# Patient Record
Sex: Male | Born: 1945 | Race: White | Hispanic: No | Marital: Single | Smoking: Never smoker
Health system: Southern US, Community
[De-identification: ages and names within clinical notes are randomized; demographics above are authoritative.]

## PROBLEM LIST (undated history)

## (undated) DIAGNOSIS — R079 Chest pain, unspecified: Secondary | ICD-10-CM

## (undated) DIAGNOSIS — R0602 Shortness of breath: Secondary | ICD-10-CM

## (undated) DIAGNOSIS — I10 Essential (primary) hypertension: Secondary | ICD-10-CM

## (undated) HISTORY — DX: Shortness of breath: R06.02

## (undated) HISTORY — DX: Essential (primary) hypertension: I10

## (undated) HISTORY — PX: CARDIAC CATHETERIZATION: SHX172

## (undated) HISTORY — DX: Chest pain, unspecified: R07.9

---

## 1999-12-05 ENCOUNTER — Encounter: Payer: Self-pay | Admitting: Internal Medicine

## 1999-12-05 ENCOUNTER — Encounter: Admission: RE | Admit: 1999-12-05 | Discharge: 1999-12-05 | Payer: Self-pay | Admitting: Internal Medicine

## 2001-11-27 ENCOUNTER — Encounter: Payer: Self-pay | Admitting: Emergency Medicine

## 2001-11-27 ENCOUNTER — Inpatient Hospital Stay (HOSPITAL_COMMUNITY): Admission: EM | Admit: 2001-11-27 | Discharge: 2001-11-28 | Payer: Self-pay | Admitting: Emergency Medicine

## 2001-11-28 ENCOUNTER — Encounter: Payer: Self-pay | Admitting: Cardiovascular Disease

## 2009-12-14 ENCOUNTER — Encounter: Admission: RE | Admit: 2009-12-14 | Discharge: 2009-12-14 | Payer: Self-pay | Admitting: Family Medicine

## 2011-02-16 NOTE — Cardiovascular Report (Signed)
. [facility]  Patient:    Thomas Bridges, Thomas Bridges Visit Number: [id number] MRN: [id number]          Service Type: MED Location: 2000 2034 01 Attending Physician:  Berry, Jonathan [place] Dictated by:   Delrae Rend, M.D. Proc. Date: 11/28/01 Admit Date:  11/27/2001 Discharge Date: 11/28/2001   CC:         Cardiac Catheterization Laboratory  Pearla Dubonnet, M.D.  [facility]   Cardiac Catheterization  PROCEDURE PERFORMED 1. Left ventriculography. 2. Selective right and left coronary arteriography. 3. Right femoral angiography and closure of the right femoral artery    access sheath with Perclose.  CARDIOLOGIST:  Delrae Rend, M.D.  INDICATIONS:  Mr. Eltringham is a 65 year old gentleman with no significant past medical history.  He was admitted to the hospital with chest pain, suggestive of angina.  He continued to have chest pain in spite of medical therapy, and he had a first set of troponin which was abnormal.  Given his continued chest pain and troponin which was abnormal, he was brought to the cardiac catheterization laboratory to evaluate his coronary anatomy.  HEMODYNAMIC DATA Left ventricular pressure:  133/90. End diastolic pressure:  16 mmHg. Central aortic pressure:  130/69, with a mean of 97 mmHg. There was no significant pressure gradient across the aortic valve.  ANGIOGRAPHIC DATA Left ventriculogram:  The left ventricular systolic function is well preserved in its entirety and is estimated at 60%.  There is no significant mitral regurgitation.  RESULTS 1. Right coronary artery:  The right coronary artery is codominant with    the circumflex coronary artery.  It has a high bifurcation of PDA and    PRV branches.  It is smooth and normal. 2. Left main coronary artery:  The left main coronary artery is a large    caliber vessel.  It is normal. 3. Left anterior descending coronary artery:  The  left anterior descending    coronary artery is a large caliber vessel.  It is smooth in caliber, and    is normal.  It gives origin to a moderate-sized diagonal-1 and    diagonal-2. 4. Circumflex coronary artery:  The circumflex coronary artery is codominant    with the right coronary artery.  It gives origin to a high obtuse    marginal-1and an obtuse marginal-2, and continues as obtuse    marginal-3 which gives origin to several small PDA branches.  The    circumflex coronary artery is normal.  The ascending aortogram revealed    the presence of three aortic valve cusps.  There is no evidence of an    aortic dissection or evidence of aortic regurgitation.  RIGHT FEMORAL ANGIOGRAPHY:  Revealed a good arterial access site.  IMPRESSION 1. Normal left ventricular systolic function. 2. Normal coronary arteries.  RECOMMENDATIONS 1. With his normal coronary arteries, a noncardiac cause of chest pain    evaluation may be indicated. 2. The patient will be discharged home today, and followed up as an    outpatient.  The arterial access sheath has been closed with Perclose.  DESCRIPTION OF PROCEDURE:  Under the usual sterile precautions using a 6-French right femoral arterial access, a 6-French multipurpose V2 catheter was advanced into the ascending aorta over the 0.035 mm J-wire. The catheter was gently advanced into the left ventricle, and the left ventricular pressure monitored.  Hand contrast injection of the left ventricle was performed in the RAO and LAO projections.  The catheter was flushed with saline, pulled back into the ascending aorta, and aortic pressure was monitored.  The right coronary artery was selectively engaged and an arteriography was performed.  This was flushed.  The left main coronary artery was selectively engaged, and an arteriography was performed.  Then the catheter was pulled into the aortic root and the ascending aortogram was performed in the  LAO projection.  Then the catheter was pulled out of the body.  A right femoral arteriography was performed in the RAO projection, and an arterial access sheath was closed with Perclose.  Excellent hemostasis was obtained, and the patient was taken to the recovery room in a stable condition. Dictated by:   Delrae Rend, M.D. Attending Physician:  Berry, Jonathan [place] DD:  11/28/01 TD:  11/30/01 Job: [id number]

## 2011-02-16 NOTE — Discharge Summary (Signed)
. [facility]  Patient:    Thomas Bridges, Thomas Bridges Visit Number: [id number] MRN: [id number]          Service Type: MED Location: 2000 2034 01 Attending Physician:  Berry, Jonathan [place] Dictated by:   Halford Decamp Delanna Ahmadi, R.N., N.P. Admit Date:  11/27/2001 Discharge Date: 11/28/2001   CC:         Madaline Savage, M.D.  Pearla Dubonnet, M.D.   Discharge Summary  HISTORY OF PRESENT ILLNESS:  The patient is a 65 year old, white, married, male patient admitted from the ER secondary to chest pain.  He stated that he had a cath 7 years ago by Dr. Elsie Lincoln and he did not have any stenosis.  He never had office followup after that.  He sees Dr. Kevan Ny approximately every 2 years for a complete physical.  He stated yesterday he was getting limbs off his neighbors yard, he suddenly became short of breath, he had sharp sticking pains, he felt hot, he felt limp, his right arm heavy, he felt mentally out of it, his hands were shaky, he called his wife at work, came home at the end of the day and thought he looked bad so she brought him to emergency room. Episode started about 3:30 p.m. and he came to the ER about 6 p.m.  They started him on IV nitroglycerin and IV heparin.  He was also started on aspirin, Plavix, and Toprol secondary to his complaints about his chest pain. When seen on November 28, 2001, he had intermittent hurting in his mid-chest. He denies any previous hypertension, CVA, DM, no previous CAD, no abdominal aortic aneurysm.  He denies any peptic ulcer disease, no blood clots and no previous hemorrhage.  INITIAL LABORATORIES:  His hemoglobin 13.8, hematocrit was 41.1, wbcs were 6.3, platelets were 257.  His sodium was 139, potassium 3.5, his BUN was 22, his creatinine was 1.0.  His CK-MBs (#1) 226/4.0, troponin 0.02, (#2) was 198/3.2 with a troponin of 0.07.  His EKG showed normal sinus rhythm, he had no ischemic abnormality on it.  HOSPITAL  COURSE:  Because of his symptoms and his elevated troponin, it was decided he should go for a cardiac catheterization; this was performed in the late afternoon on November 28, 2001 by Dr. Delrae Rend.  He had normal coronaries, his EF was 60%, he had no MR, he was perclosed and it was decided he could be discharged home if his groin were stable, he was able to walk in the hallway without any incidents in approximately 3 hours.  Thus, he will be tentatively discharged.  To note, his blood pressure was 197/87 when seen in the ER, his pulse was 105, his respirations were 24, his temperature was 98.5. When he was seen up on the floor, his blood pressure dropped down to 144/76, his pulse was 78, his respirations were 28, his room air O2 saturation was 98%.  We did draw a cholesterol on him and his total cholesterol was 209, his triglycerides were 116, his HDL was 60, and his LDL was 126.  As stated before, he stated he had no prior history of hypertension.  At this time we will not send him home on any hypertensive medications but he may need to be started on this once seen as an outpatient in 1 week.  He will currently be sent home on enteric-coated aspirin 325 mg once per day.  He is to do no strenuous activity, no lifting, no social  activities for 4 days.  He is not to return to work until after he is seen in the office on December 03, 2001.  He cannot sit in a tub of water, hot tub, etc., for 5 days.  If he has any problems with his groin, he will call our office.  DISCHARGE DIAGNOSES: 1. Chest pain somewhat atypical not ischemic related with normal coronaries. 2. Sudden onset of shortness of breath with normal ________ in the hospital. 3. Hypertension when first seen in the hospital in the emergency room. 4. Decreased exercise tolerance over the last 1 year.  PLAN:  He was told to have an office appointment with Dr. Kevan Ny preferably this week.  He was also told that if he has recurrent  symptoms that he is to call someone to drive him to the ER immediately or he is to call 911, he should not try to drive himself. Dictated by:   Halford Decamp Delanna Ahmadi, R.N., N.P. Attending Physician:  Berry, Jonathan [place] DD:  11/28/01 TD:  11/29/01 Job: [id number]

## 2014-03-24 ENCOUNTER — Encounter: Payer: Self-pay | Admitting: Cardiology

## 2014-03-24 ENCOUNTER — Encounter: Payer: Self-pay | Admitting: *Deleted

## 2014-10-05 DIAGNOSIS — C4441 Basal cell carcinoma of skin of scalp and neck: Secondary | ICD-10-CM | POA: Diagnosis not present

## 2014-10-05 DIAGNOSIS — C4442 Squamous cell carcinoma of skin of scalp and neck: Secondary | ICD-10-CM | POA: Diagnosis not present

## 2014-11-25 DIAGNOSIS — L570 Actinic keratosis: Secondary | ICD-10-CM | POA: Diagnosis not present

## 2014-11-25 DIAGNOSIS — C44621 Squamous cell carcinoma of skin of unspecified upper limb, including shoulder: Secondary | ICD-10-CM | POA: Diagnosis not present

## 2014-11-25 DIAGNOSIS — D485 Neoplasm of uncertain behavior of skin: Secondary | ICD-10-CM | POA: Diagnosis not present

## 2014-12-21 DIAGNOSIS — H5203 Hypermetropia, bilateral: Secondary | ICD-10-CM | POA: Diagnosis not present

## 2014-12-21 DIAGNOSIS — H52222 Regular astigmatism, left eye: Secondary | ICD-10-CM | POA: Diagnosis not present

## 2014-12-21 DIAGNOSIS — H5213 Myopia, bilateral: Secondary | ICD-10-CM | POA: Diagnosis not present

## 2014-12-21 DIAGNOSIS — H5210 Myopia, unspecified eye: Secondary | ICD-10-CM | POA: Diagnosis not present

## 2014-12-21 DIAGNOSIS — H2513 Age-related nuclear cataract, bilateral: Secondary | ICD-10-CM | POA: Diagnosis not present

## 2014-12-21 DIAGNOSIS — H524 Presbyopia: Secondary | ICD-10-CM | POA: Diagnosis not present

## 2015-02-24 DIAGNOSIS — H2513 Age-related nuclear cataract, bilateral: Secondary | ICD-10-CM | POA: Diagnosis not present

## 2015-02-24 DIAGNOSIS — H3531 Nonexudative age-related macular degeneration: Secondary | ICD-10-CM | POA: Diagnosis not present

## 2015-04-08 ENCOUNTER — Other Ambulatory Visit: Payer: Self-pay | Admitting: Internal Medicine

## 2015-04-08 DIAGNOSIS — G4710 Hypersomnia, unspecified: Secondary | ICD-10-CM | POA: Diagnosis not present

## 2015-04-08 DIAGNOSIS — D81818 Other biotin-dependent carboxylase deficiency: Secondary | ICD-10-CM | POA: Diagnosis not present

## 2015-04-08 DIAGNOSIS — Z0001 Encounter for general adult medical examination with abnormal findings: Secondary | ICD-10-CM | POA: Diagnosis not present

## 2015-04-08 DIAGNOSIS — R2990 Unspecified symptoms and signs involving the nervous system: Secondary | ICD-10-CM | POA: Diagnosis not present

## 2015-04-08 DIAGNOSIS — G5600 Carpal tunnel syndrome, unspecified upper limb: Secondary | ICD-10-CM | POA: Diagnosis not present

## 2015-04-08 DIAGNOSIS — M6281 Muscle weakness (generalized): Secondary | ICD-10-CM

## 2015-04-08 DIAGNOSIS — R471 Dysarthria and anarthria: Secondary | ICD-10-CM | POA: Diagnosis not present

## 2015-04-08 DIAGNOSIS — R413 Other amnesia: Secondary | ICD-10-CM | POA: Diagnosis not present

## 2015-04-21 ENCOUNTER — Ambulatory Visit
Admission: RE | Admit: 2015-04-21 | Discharge: 2015-04-21 | Disposition: A | Discharge status: Home / Self Care | Payer: Self-pay | Source: Ambulatory Visit | Attending: Internal Medicine | Admitting: Internal Medicine

## 2015-04-21 DIAGNOSIS — M6281 Muscle weakness (generalized): Secondary | ICD-10-CM

## 2015-04-21 DIAGNOSIS — R29810 Facial weakness: Secondary | ICD-10-CM | POA: Diagnosis not present

## 2015-04-21 MED ORDER — GADOBENATE DIMEGLUMINE 529 MG/ML IV SOLN: 14.0000 mL | Freq: Once | INTRAVENOUS | Status: AC | PRN

## 2015-06-21 DIAGNOSIS — D692 Other nonthrombocytopenic purpura: Secondary | ICD-10-CM | POA: Diagnosis not present

## 2015-06-21 DIAGNOSIS — L570 Actinic keratosis: Secondary | ICD-10-CM | POA: Diagnosis not present

## 2015-06-21 DIAGNOSIS — D045 Carcinoma in situ of skin of trunk: Secondary | ICD-10-CM | POA: Diagnosis not present

## 2015-10-13 DIAGNOSIS — M545 Low back pain: Secondary | ICD-10-CM | POA: Diagnosis not present

## 2015-10-13 DIAGNOSIS — R2990 Unspecified symptoms and signs involving the nervous system: Secondary | ICD-10-CM | POA: Diagnosis not present

## 2015-10-13 DIAGNOSIS — D81818 Other biotin-dependent carboxylase deficiency: Secondary | ICD-10-CM | POA: Diagnosis not present

## 2015-10-13 DIAGNOSIS — R471 Dysarthria and anarthria: Secondary | ICD-10-CM | POA: Diagnosis not present

## 2015-10-13 DIAGNOSIS — R269 Unspecified abnormalities of gait and mobility: Secondary | ICD-10-CM | POA: Diagnosis not present

## 2015-11-10 DIAGNOSIS — H43813 Vitreous degeneration, bilateral: Secondary | ICD-10-CM | POA: Diagnosis not present

## 2015-11-10 DIAGNOSIS — H2513 Age-related nuclear cataract, bilateral: Secondary | ICD-10-CM | POA: Diagnosis not present

## 2015-11-10 DIAGNOSIS — H353190 Nonexudative age-related macular degeneration, unspecified eye, stage unspecified: Secondary | ICD-10-CM | POA: Diagnosis not present

## 2016-03-21 DIAGNOSIS — H5213 Myopia, bilateral: Secondary | ICD-10-CM | POA: Diagnosis not present

## 2016-03-21 DIAGNOSIS — H33303 Unspecified retinal break, bilateral: Secondary | ICD-10-CM | POA: Diagnosis not present

## 2016-03-21 DIAGNOSIS — H5210 Myopia, unspecified eye: Secondary | ICD-10-CM | POA: Diagnosis not present

## 2016-04-18 DIAGNOSIS — Z0001 Encounter for general adult medical examination with abnormal findings: Secondary | ICD-10-CM | POA: Diagnosis not present

## 2016-04-18 DIAGNOSIS — R413 Other amnesia: Secondary | ICD-10-CM | POA: Diagnosis not present

## 2016-04-18 DIAGNOSIS — M545 Low back pain: Secondary | ICD-10-CM | POA: Diagnosis not present

## 2016-04-18 DIAGNOSIS — Z1389 Encounter for screening for other disorder: Secondary | ICD-10-CM | POA: Diagnosis not present

## 2016-04-18 DIAGNOSIS — R269 Unspecified abnormalities of gait and mobility: Secondary | ICD-10-CM | POA: Diagnosis not present

## 2016-04-18 DIAGNOSIS — Z79899 Other long term (current) drug therapy: Secondary | ICD-10-CM | POA: Diagnosis not present

## 2016-04-18 DIAGNOSIS — R739 Hyperglycemia, unspecified: Secondary | ICD-10-CM | POA: Diagnosis not present

## 2016-04-18 DIAGNOSIS — D81818 Other biotin-dependent carboxylase deficiency: Secondary | ICD-10-CM | POA: Diagnosis not present

## 2016-04-18 DIAGNOSIS — L570 Actinic keratosis: Secondary | ICD-10-CM | POA: Diagnosis not present

## 2016-04-18 DIAGNOSIS — L219 Seborrheic dermatitis, unspecified: Secondary | ICD-10-CM | POA: Diagnosis not present

## 2016-05-30 DIAGNOSIS — H2513 Age-related nuclear cataract, bilateral: Secondary | ICD-10-CM | POA: Diagnosis not present

## 2016-05-30 DIAGNOSIS — H353190 Nonexudative age-related macular degeneration, unspecified eye, stage unspecified: Secondary | ICD-10-CM | POA: Diagnosis not present

## 2016-07-19 DIAGNOSIS — H2512 Age-related nuclear cataract, left eye: Secondary | ICD-10-CM | POA: Diagnosis not present

## 2016-07-19 DIAGNOSIS — F0390 Unspecified dementia without behavioral disturbance: Secondary | ICD-10-CM | POA: Diagnosis not present

## 2016-07-19 DIAGNOSIS — M1990 Unspecified osteoarthritis, unspecified site: Secondary | ICD-10-CM | POA: Diagnosis not present

## 2016-07-19 DIAGNOSIS — Z720 Tobacco use: Secondary | ICD-10-CM | POA: Diagnosis not present

## 2016-07-19 DIAGNOSIS — Z85820 Personal history of malignant melanoma of skin: Secondary | ICD-10-CM | POA: Diagnosis not present

## 2016-07-19 DIAGNOSIS — Z7982 Long term (current) use of aspirin: Secondary | ICD-10-CM | POA: Diagnosis not present

## 2016-07-19 DIAGNOSIS — Z8673 Personal history of transient ischemic attack (TIA), and cerebral infarction without residual deficits: Secondary | ICD-10-CM | POA: Diagnosis not present

## 2016-07-26 DIAGNOSIS — H25812 Combined forms of age-related cataract, left eye: Secondary | ICD-10-CM | POA: Diagnosis not present

## 2016-07-26 DIAGNOSIS — Z7982 Long term (current) use of aspirin: Secondary | ICD-10-CM | POA: Diagnosis not present

## 2016-07-26 DIAGNOSIS — Z85820 Personal history of malignant melanoma of skin: Secondary | ICD-10-CM | POA: Diagnosis not present

## 2016-07-26 DIAGNOSIS — Z8673 Personal history of transient ischemic attack (TIA), and cerebral infarction without residual deficits: Secondary | ICD-10-CM | POA: Diagnosis not present

## 2016-07-26 DIAGNOSIS — Z87891 Personal history of nicotine dependence: Secondary | ICD-10-CM | POA: Diagnosis not present

## 2016-07-26 DIAGNOSIS — H25813 Combined forms of age-related cataract, bilateral: Secondary | ICD-10-CM | POA: Diagnosis not present

## 2016-08-28 DIAGNOSIS — Z7982 Long term (current) use of aspirin: Secondary | ICD-10-CM | POA: Diagnosis not present

## 2016-08-28 DIAGNOSIS — H25811 Combined forms of age-related cataract, right eye: Secondary | ICD-10-CM | POA: Diagnosis not present

## 2016-08-28 DIAGNOSIS — Z87891 Personal history of nicotine dependence: Secondary | ICD-10-CM | POA: Diagnosis not present

## 2016-08-28 DIAGNOSIS — Z85820 Personal history of malignant melanoma of skin: Secondary | ICD-10-CM | POA: Diagnosis not present

## 2016-11-22 DIAGNOSIS — R739 Hyperglycemia, unspecified: Secondary | ICD-10-CM | POA: Diagnosis not present

## 2016-11-22 DIAGNOSIS — D81818 Other biotin-dependent carboxylase deficiency: Secondary | ICD-10-CM | POA: Diagnosis not present

## 2016-11-22 DIAGNOSIS — R2990 Unspecified symptoms and signs involving the nervous system: Secondary | ICD-10-CM | POA: Diagnosis not present

## 2016-11-22 DIAGNOSIS — R413 Other amnesia: Secondary | ICD-10-CM | POA: Diagnosis not present

## 2016-11-22 DIAGNOSIS — R269 Unspecified abnormalities of gait and mobility: Secondary | ICD-10-CM | POA: Diagnosis not present

## 2016-11-22 DIAGNOSIS — L570 Actinic keratosis: Secondary | ICD-10-CM | POA: Diagnosis not present

## 2017-02-28 DIAGNOSIS — Z961 Presence of intraocular lens: Secondary | ICD-10-CM | POA: Diagnosis not present

## 2017-02-28 DIAGNOSIS — H353132 Nonexudative age-related macular degeneration, bilateral, intermediate dry stage: Secondary | ICD-10-CM | POA: Diagnosis not present

## 2017-02-28 DIAGNOSIS — H43813 Vitreous degeneration, bilateral: Secondary | ICD-10-CM | POA: Diagnosis not present

## 2017-05-08 DIAGNOSIS — H3530 Unspecified macular degeneration: Secondary | ICD-10-CM | POA: Diagnosis not present

## 2017-05-08 DIAGNOSIS — Z961 Presence of intraocular lens: Secondary | ICD-10-CM | POA: Diagnosis not present

## 2017-05-08 DIAGNOSIS — Z9841 Cataract extraction status, right eye: Secondary | ICD-10-CM | POA: Diagnosis not present

## 2017-05-08 DIAGNOSIS — Z9842 Cataract extraction status, left eye: Secondary | ICD-10-CM | POA: Diagnosis not present

## 2017-05-28 DIAGNOSIS — R739 Hyperglycemia, unspecified: Secondary | ICD-10-CM | POA: Diagnosis not present

## 2017-05-28 DIAGNOSIS — G309 Alzheimer's disease, unspecified: Secondary | ICD-10-CM | POA: Diagnosis not present

## 2017-05-28 DIAGNOSIS — Z1211 Encounter for screening for malignant neoplasm of colon: Secondary | ICD-10-CM | POA: Diagnosis not present

## 2017-05-28 DIAGNOSIS — Z0000 Encounter for general adult medical examination without abnormal findings: Secondary | ICD-10-CM | POA: Diagnosis not present

## 2017-05-28 DIAGNOSIS — D81818 Other biotin-dependent carboxylase deficiency: Secondary | ICD-10-CM | POA: Diagnosis not present

## 2017-05-28 DIAGNOSIS — F322 Major depressive disorder, single episode, severe without psychotic features: Secondary | ICD-10-CM | POA: Diagnosis not present

## 2017-05-28 DIAGNOSIS — H6123 Impacted cerumen, bilateral: Secondary | ICD-10-CM | POA: Diagnosis not present

## 2017-05-28 DIAGNOSIS — L570 Actinic keratosis: Secondary | ICD-10-CM | POA: Diagnosis not present

## 2017-05-31 ENCOUNTER — Emergency Department (HOSPITAL_COMMUNITY): Payer: Medicare HMO

## 2017-05-31 ENCOUNTER — Encounter (HOSPITAL_COMMUNITY): Payer: Self-pay

## 2017-05-31 ENCOUNTER — Emergency Department (HOSPITAL_COMMUNITY)
Admission: EM | Admit: 2017-05-31 | Discharge: 2017-05-31 | Disposition: A | Discharge status: Home / Self Care | Payer: Medicare HMO | Attending: Emergency Medicine | Admitting: Emergency Medicine

## 2017-05-31 DIAGNOSIS — T50905A Adverse effect of unspecified drugs, medicaments and biological substances, initial encounter: Secondary | ICD-10-CM

## 2017-05-31 DIAGNOSIS — Y636 Underdosing and nonadministration of necessary drug, medicament or biological substance: Secondary | ICD-10-CM | POA: Diagnosis not present

## 2017-05-31 DIAGNOSIS — T887XXA Unspecified adverse effect of drug or medicament, initial encounter: Secondary | ICD-10-CM | POA: Diagnosis not present

## 2017-05-31 DIAGNOSIS — R2689 Other abnormalities of gait and mobility: Secondary | ICD-10-CM

## 2017-05-31 DIAGNOSIS — Z9181 History of falling: Secondary | ICD-10-CM | POA: Insufficient documentation

## 2017-05-31 DIAGNOSIS — Y829 Unspecified medical devices associated with adverse incidents: Secondary | ICD-10-CM | POA: Insufficient documentation

## 2017-05-31 DIAGNOSIS — I10 Essential (primary) hypertension: Secondary | ICD-10-CM | POA: Insufficient documentation

## 2017-05-31 DIAGNOSIS — R4182 Altered mental status, unspecified: Secondary | ICD-10-CM | POA: Diagnosis not present

## 2017-05-31 DIAGNOSIS — T43295A Adverse effect of other antidepressants, initial encounter: Secondary | ICD-10-CM | POA: Diagnosis not present

## 2017-05-31 LAB — I-STAT TROPONIN, ED: Troponin i, poc: 0.01 ng/mL (ref 0.00–0.08)

## 2017-05-31 LAB — COMPREHENSIVE METABOLIC PANEL
ALT: 13 U/L — AB (ref 17–63)
AST: 10 U/L — AB (ref 15–41)
Albumin: 3.5 g/dL (ref 3.5–5.0)
Alkaline Phosphatase: 102 U/L (ref 38–126)
Anion gap: 8 (ref 5–15)
BUN: 27 mg/dL — AB (ref 6–20)
CHLORIDE: 109 mmol/L (ref 101–111)
CO2: 26 mmol/L (ref 22–32)
CREATININE: 0.86 mg/dL (ref 0.61–1.24)
Calcium: 9 mg/dL (ref 8.9–10.3)
GFR calc non Af Amer: >60 mL/min (ref >60)
Glucose, Bld: 95 mg/dL (ref 65–99)
Potassium: 4 mmol/L (ref 3.5–5.1)
SODIUM: 143 mmol/L (ref 135–145)
Total Bilirubin: 0.5 mg/dL (ref 0.3–1.2)
Total Protein: 5.8 g/dL — ABNORMAL LOW (ref 6.5–8.1)

## 2017-05-31 LAB — CBC WITH DIFFERENTIAL/PLATELET
BASOS ABS: 0 10*3/uL (ref 0.0–0.1)
BASOS PCT: 0 %
EOS ABS: 0.1 10*3/uL (ref 0.0–0.7)
EOS PCT: 2 %
HCT: 36.1 % — ABNORMAL LOW (ref 39.0–52.0)
Hemoglobin: 11.5 g/dL — ABNORMAL LOW (ref 13.0–17.0)
Lymphocytes Relative: 14 %
Lymphs Abs: 0.9 10*3/uL (ref 0.7–4.0)
MCH: 31 pg (ref 26.0–34.0)
MCHC: 31.9 g/dL (ref 30.0–36.0)
MCV: 97.3 fL (ref 78.0–100.0)
Monocytes Absolute: 0.9 10*3/uL (ref 0.1–1.0)
Monocytes Relative: 14 %
Neutro Abs: 4.5 10*3/uL (ref 1.7–7.7)
Neutrophils Relative %: 70 %
PLATELETS: 228 10*3/uL (ref 150–400)
RBC: 3.71 MIL/uL — AB (ref 4.22–5.81)
RDW: 13.2 % (ref 11.5–15.5)
WBC: 6.4 10*3/uL (ref 4.0–10.5)

## 2017-05-31 LAB — URINALYSIS, ROUTINE W REFLEX MICROSCOPIC
BILIRUBIN URINE: NEGATIVE
Glucose, UA: NEGATIVE mg/dL
Hgb urine dipstick: NEGATIVE
Ketones, ur: NEGATIVE mg/dL
Leukocytes, UA: NEGATIVE
NITRITE: NEGATIVE
PH: 6 (ref 5.0–8.0)
Protein, ur: NEGATIVE mg/dL
SPECIFIC GRAVITY, URINE: 1.02 (ref 1.005–1.030)

## 2017-05-31 NOTE — ED Notes (Signed)
Per Wife started Venlafaxine ER 75mg  Tues for depression and suicidical ideation. She states pt has been obtunded, slow to respond to questions, falling frequently, needing more assistance with ADLs, and increased confusion since then. Last night fell hitting his head, neg LOC. Small abrasion noted to forehead. Pt also has abrasion to right lower back from fall this week. Pt awake, alert, following commands at this time. Disoriented to time, situation.

## 2017-05-31 NOTE — ED Notes (Signed)
ED Provider at bedside. 

## 2017-05-31 NOTE — ED Notes (Signed)
Pt transported to MRI 

## 2017-05-31 NOTE — ED Notes (Signed)
Patient transported to CT 

## 2017-05-31 NOTE — ED Triage Notes (Signed)
Pt presents to the er with family for multiple complaints.  The patient has dementia and has been having a lot of depression, he went to see his primary doctor and they prescribed him a medication for it on Tuesday.  Since Wednesday wife said he has not been acting right, states that he acts like he doesn't understand her and has been having a lot of frequent falls. The patient last fell last night and did hit his head, did not take blood thinners.

## 2017-06-01 NOTE — ED Provider Notes (Signed)
[place] [facility] DEPT Provider Note   CSN: [id number] Arrival date & time: 05/31/17  8676     History   Chief Complaint Chief Complaint  Patient presents with  . Medication Reaction  . frequent falls    HPI Thomas Bridges is a 71 y.o. male.  HPI   71 year old male with a history of hypertension and dementia presents with concern for difficulty balancing and frequent falls for the last 4 days.  Family reports that he had some depression, was started on venlafaxine by his primary care physician on Tuesday. Reports after he began taking it, they noted he was having frequent falls, unsteadiness walking worse than usual, and difficulty standing up. Reports that he will stare at times and seemed out of it. They stopped the medication yesterday, but symptoms continued so they came to the emergency department for evaluation. Report otherwise he's had no vomiting, no diarrhea, no fevers, no cough, no noted facial droop or focal weakness. They report he is not a reliable historian, but he is not reported headache, chest pain, shortness of breath, abdominal pain. They report he hit his head and cut his elbow with one of the falls.    Past Medical History:  Diagnosis Date  . Chest pain   . HTN (hypertension)   . SOB (shortness of breath)     There are no active problems to display for this patient.   Past Surgical History:  Procedure Laterality Date  . CARDIAC CATHETERIZATION         Home Medications    Prior to Admission medications   Not on File    Family History No family history on file.  Social History Social History  Substance Use Topics  . Smoking status: Never Smoker  . Smokeless tobacco: Never Used  . Alcohol use No     Allergies   Patient has no known allergies.   Review of Systems Review of Systems  Constitutional: Negative for fever.  HENT: Negative for sore throat.   Eyes: Negative for visual disturbance.  Respiratory: Negative for shortness of  breath.   Cardiovascular: Negative for chest pain.  Gastrointestinal: Negative for abdominal pain, diarrhea, nausea and vomiting.  Genitourinary: Negative for difficulty urinating.  Musculoskeletal: Positive for gait problem. Negative for back pain and neck stiffness.  Skin: Negative for rash.  Neurological: Positive for headaches. Negative for syncope, facial asymmetry, speech difficulty and weakness.     Physical Exam Updated Vital Signs BP (!) 157/72 (BP Location: Right Arm)   Pulse 78   Temp 97.9 F (36.6 C) (Oral)   Resp 18   Wt 67.6 kg (149 lb)   SpO2 96%   Physical Exam  Constitutional: He appears well-developed and well-nourished. No distress.  HENT:  Head: Normocephalic and atraumatic.  Eyes: Conjunctivae and EOM are normal.  Neck: Normal range of motion.  Cardiovascular: Normal rate, regular rhythm, normal heart sounds and intact distal pulses.  Exam reveals no gallop and no friction rub.   No murmur heard. Pulmonary/Chest: Effort normal and breath sounds normal. No respiratory distress. He has no wheezes. He has no rales.  Abdominal: Soft. He exhibits no distension. There is no tenderness. There is no guarding.  Musculoskeletal: He exhibits no edema.  Neurological: He is alert. He has normal strength. No cranial nerve deficit or sensory deficit.  Oriented to self and location not date/year Unable to perform finger to nose, unclear if past-pointing or if having difficulty understanding directions   Skin: Skin is warm  and dry. He is not diaphoretic.  Nursing note and vitals reviewed.    ED Treatments / Results  Labs (all labs ordered are listed, but only abnormal results are displayed) Labs Reviewed  CBC WITH DIFFERENTIAL/PLATELET - Abnormal; Notable for the following:       Result Value   RBC 3.71 (*)    Hemoglobin 11.5 (*)    HCT 36.1 (*)    All other components within normal limits  COMPREHENSIVE METABOLIC PANEL - Abnormal; Notable for the following:     BUN 27 (*)    Total Protein 5.8 (*)    AST 10 (*)    ALT 13 (*)    All other components within normal limits  URINALYSIS, ROUTINE W REFLEX MICROSCOPIC - Abnormal; Notable for the following:    APPearance HAZY (*)    All other components within normal limits  I-STAT TROPONIN, ED    EKG  EKG Interpretation  Date/Time:  Friday May 31 2017 12:44:38 EDT Ventricular Rate:  55 PR Interval:    QRS Duration: 133 QT Interval:  438 QTC Calculation: 419 R Axis:   -50 Text Interpretation:  Sinus rhythm Nonspecific IVCD with LAD Left ventricular hypertrophy Nonspecific conduction delay is new in comparison to prior in 2003 Confirmed by Gareth Morgan [phone]) on 05/31/2017 1:23:15 PM       Radiology Ct Head Wo Contrast  Result Date: 05/31/2017 CLINICAL DATA:  Altered mental status, confusion. EXAM: CT HEAD WITHOUT CONTRAST TECHNIQUE: Contiguous axial images were obtained from the base of the skull through the vertex without intravenous contrast. COMPARISON:  04/21/2015 FINDINGS: Brain: Mild age related volume loss. No acute intracranial abnormality. Specifically, no hemorrhage, hydrocephalus, mass lesion, acute infarction, or significant intracranial injury. Vascular: No hyperdense vessel or unexpected calcification. Skull: No acute calvarial abnormality. Sinuses/Orbits: Visualized paranasal sinuses and mastoids clear. Orbital soft tissues unremarkable. Other: None IMPRESSION: No acute intracranial abnormality. Electronically Signed   By: Rolm Baptise M.D.   On: 05/31/2017 14:34   Mr Brain Wo Contrast  Result Date: 05/31/2017 CLINICAL DATA:  71 year old male with ataxia. Altered mental status and confusion. EXAM: MRI HEAD WITHOUT CONTRAST TECHNIQUE: Multiplanar, multiecho pulse sequences of the brain and surrounding structures were obtained without intravenous contrast. COMPARISON:  Head CT without contrast 1429 hours today. Brain MRI 04/21/2015 FINDINGS: Brain: No restricted diffusion to  suggest acute infarction. No midline shift, mass effect, evidence of mass lesion, ventriculomegaly, extra-axial collection or acute intracranial hemorrhage. Cervicomedullary junction and pituitary are within normal limits. Pearline Cables and white matter signal appears stable since 2016 and normal for age throughout the brain. No cortical encephalomalacia or chronic cerebral blood products. Vascular: Major intracranial vascular flow voids are stable since 2016. Skull and upper cervical spine: Negative. Visualized bone marrow signal is within normal limits. Sinuses/Orbits: Interval postoperative changes to both globes. Otherwise negative orbits soft tissues. Visualized paranasal sinuses and mastoids are stable and well pneumatized. Other: Visible internal auditory structures appear normal. Scalp and face soft tissues appear negative. IMPRESSION: No acute intracranial abnormality. Stable since 2016 and normal for age noncontrast MRI appearance of the brain. Electronically Signed   By: Genevie Ann M.D.   On: 05/31/2017 16:03    Procedures Procedures (including critical care time)  Medications Ordered in ED Medications - No data to display   Initial Impression / Assessment and Plan / ED Course  I have reviewed the triage vital signs and the nursing notes.  Pertinent labs & imaging results that were available during my  care of the patient were reviewed by me and considered in my medical decision making (see chart for details).     71 year old male with a history of hypertension and dementia presents with concern for difficulty balancing and frequent falls for the last 4 days.  Stroke workup including MRI and CT show no acute findings. Patient with good strength in his legs on examination, no neck or back pain, doubt spinal etiology. Workup for generalized weakness completed showing no sign of infection, no sign of arrhythmia or MI. Mild anemia noted however doubt this is contributor. Patient most likely with effects  of medication as etiology for recent gait instability versus worsening dementia.  Recommend continued outpatient follow up.    Final Clinical Impressions(s) / ED Diagnoses   Final diagnoses:  Adverse effect of drug, initial encounter  Imbalance    New Prescriptions There are no discharge medications for this patient.    Gareth Morgan, MD 06/01/17 [phone]

## 2017-06-05 DIAGNOSIS — T887XXD Unspecified adverse effect of drug or medicament, subsequent encounter: Secondary | ICD-10-CM | POA: Diagnosis not present

## 2017-06-05 DIAGNOSIS — S060X9A Concussion with loss of consciousness of unspecified duration, initial encounter: Secondary | ICD-10-CM | POA: Diagnosis not present

## 2017-06-19 DIAGNOSIS — Z23 Encounter for immunization: Secondary | ICD-10-CM | POA: Diagnosis not present

## 2017-06-28 DIAGNOSIS — Z1212 Encounter for screening for malignant neoplasm of rectum: Secondary | ICD-10-CM | POA: Diagnosis not present

## 2017-06-28 DIAGNOSIS — Z1211 Encounter for screening for malignant neoplasm of colon: Secondary | ICD-10-CM | POA: Diagnosis not present

## 2017-07-31 DIAGNOSIS — R269 Unspecified abnormalities of gait and mobility: Secondary | ICD-10-CM | POA: Diagnosis not present

## 2017-07-31 DIAGNOSIS — G309 Alzheimer's disease, unspecified: Secondary | ICD-10-CM | POA: Diagnosis not present

## 2017-07-31 DIAGNOSIS — F322 Major depressive disorder, single episode, severe without psychotic features: Secondary | ICD-10-CM | POA: Diagnosis not present

## 2017-07-31 DIAGNOSIS — S060X9A Concussion with loss of consciousness of unspecified duration, initial encounter: Secondary | ICD-10-CM | POA: Diagnosis not present

## 2017-07-31 DIAGNOSIS — D81818 Other biotin-dependent carboxylase deficiency: Secondary | ICD-10-CM | POA: Diagnosis not present

## 2017-07-31 DIAGNOSIS — T887XXD Unspecified adverse effect of drug or medicament, subsequent encounter: Secondary | ICD-10-CM | POA: Diagnosis not present

## 2017-10-28 DIAGNOSIS — C4442 Squamous cell carcinoma of skin of scalp and neck: Secondary | ICD-10-CM | POA: Diagnosis not present

## 2017-10-28 DIAGNOSIS — L570 Actinic keratosis: Secondary | ICD-10-CM | POA: Diagnosis not present

## 2017-10-28 DIAGNOSIS — D049 Carcinoma in situ of skin, unspecified: Secondary | ICD-10-CM | POA: Diagnosis not present

## 2017-12-05 DIAGNOSIS — C4442 Squamous cell carcinoma of skin of scalp and neck: Secondary | ICD-10-CM | POA: Diagnosis not present

## 2017-12-05 DIAGNOSIS — L988 Other specified disorders of the skin and subcutaneous tissue: Secondary | ICD-10-CM | POA: Diagnosis not present

## 2018-01-21 DIAGNOSIS — R410 Disorientation, unspecified: Secondary | ICD-10-CM | POA: Diagnosis not present

## 2018-01-21 DIAGNOSIS — R269 Unspecified abnormalities of gait and mobility: Secondary | ICD-10-CM | POA: Diagnosis not present

## 2018-01-21 DIAGNOSIS — R609 Edema, unspecified: Secondary | ICD-10-CM | POA: Diagnosis not present

## 2018-01-21 DIAGNOSIS — D81818 Other biotin-dependent carboxylase deficiency: Secondary | ICD-10-CM | POA: Diagnosis not present

## 2018-01-21 DIAGNOSIS — G309 Alzheimer's disease, unspecified: Secondary | ICD-10-CM | POA: Diagnosis not present

## 2018-01-21 DIAGNOSIS — R600 Localized edema: Secondary | ICD-10-CM | POA: Diagnosis not present

## 2018-01-21 DIAGNOSIS — F322 Major depressive disorder, single episode, severe without psychotic features: Secondary | ICD-10-CM | POA: Diagnosis not present

## 2018-01-21 DIAGNOSIS — R350 Frequency of micturition: Secondary | ICD-10-CM | POA: Diagnosis not present

## 2018-01-21 DIAGNOSIS — R296 Repeated falls: Secondary | ICD-10-CM | POA: Diagnosis not present

## 2018-01-31 DIAGNOSIS — M179 Osteoarthritis of knee, unspecified: Secondary | ICD-10-CM | POA: Diagnosis not present

## 2018-03-11 DIAGNOSIS — M179 Osteoarthritis of knee, unspecified: Secondary | ICD-10-CM | POA: Diagnosis not present

## 2018-03-11 DIAGNOSIS — M545 Low back pain: Secondary | ICD-10-CM | POA: Diagnosis not present

## 2018-05-28 DIAGNOSIS — R296 Repeated falls: Secondary | ICD-10-CM | POA: Diagnosis not present

## 2018-05-28 DIAGNOSIS — R269 Unspecified abnormalities of gait and mobility: Secondary | ICD-10-CM | POA: Diagnosis not present

## 2018-05-28 DIAGNOSIS — Z0001 Encounter for general adult medical examination with abnormal findings: Secondary | ICD-10-CM | POA: Diagnosis not present

## 2018-05-28 DIAGNOSIS — G309 Alzheimer's disease, unspecified: Secondary | ICD-10-CM | POA: Diagnosis not present

## 2018-05-28 DIAGNOSIS — L570 Actinic keratosis: Secondary | ICD-10-CM | POA: Diagnosis not present

## 2018-05-28 DIAGNOSIS — R350 Frequency of micturition: Secondary | ICD-10-CM | POA: Diagnosis not present

## 2018-05-28 DIAGNOSIS — R609 Edema, unspecified: Secondary | ICD-10-CM | POA: Diagnosis not present

## 2018-05-28 DIAGNOSIS — D81818 Other biotin-dependent carboxylase deficiency: Secondary | ICD-10-CM | POA: Diagnosis not present

## 2018-05-28 DIAGNOSIS — F322 Major depressive disorder, single episode, severe without psychotic features: Secondary | ICD-10-CM | POA: Diagnosis not present

## 2018-07-28 DIAGNOSIS — R350 Frequency of micturition: Secondary | ICD-10-CM | POA: Diagnosis not present

## 2018-07-29 DIAGNOSIS — R350 Frequency of micturition: Secondary | ICD-10-CM | POA: Diagnosis not present

## 2019-03-12 ENCOUNTER — Emergency Department (HOSPITAL_COMMUNITY): Payer: Medicare HMO

## 2019-03-12 ENCOUNTER — Emergency Department (HOSPITAL_COMMUNITY)
Admission: EM | Admit: 2019-03-12 | Discharge: 2019-03-12 | Disposition: A | Discharge status: Home / Self Care | Payer: Medicare HMO | Attending: Emergency Medicine | Admitting: Emergency Medicine

## 2019-03-12 DIAGNOSIS — S299XXA Unspecified injury of thorax, initial encounter: Secondary | ICD-10-CM | POA: Diagnosis not present

## 2019-03-12 DIAGNOSIS — R531 Weakness: Secondary | ICD-10-CM | POA: Insufficient documentation

## 2019-03-12 DIAGNOSIS — I959 Hypotension, unspecified: Secondary | ICD-10-CM | POA: Diagnosis not present

## 2019-03-12 DIAGNOSIS — F0390 Unspecified dementia without behavioral disturbance: Secondary | ICD-10-CM | POA: Insufficient documentation

## 2019-03-12 DIAGNOSIS — I447 Left bundle-branch block, unspecified: Secondary | ICD-10-CM | POA: Diagnosis not present

## 2019-03-12 DIAGNOSIS — Y9389 Activity, other specified: Secondary | ICD-10-CM | POA: Diagnosis not present

## 2019-03-12 DIAGNOSIS — I10 Essential (primary) hypertension: Secondary | ICD-10-CM | POA: Diagnosis not present

## 2019-03-12 DIAGNOSIS — W1811XA Fall from or off toilet without subsequent striking against object, initial encounter: Secondary | ICD-10-CM | POA: Diagnosis not present

## 2019-03-12 DIAGNOSIS — Y92002 Bathroom of unspecified non-institutional (private) residence single-family (private) house as the place of occurrence of the external cause: Secondary | ICD-10-CM | POA: Diagnosis not present

## 2019-03-12 DIAGNOSIS — M47812 Spondylosis without myelopathy or radiculopathy, cervical region: Secondary | ICD-10-CM | POA: Diagnosis not present

## 2019-03-12 DIAGNOSIS — R296 Repeated falls: Secondary | ICD-10-CM | POA: Diagnosis not present

## 2019-03-12 DIAGNOSIS — Y999 Unspecified external cause status: Secondary | ICD-10-CM | POA: Insufficient documentation

## 2019-03-12 DIAGNOSIS — R404 Transient alteration of awareness: Secondary | ICD-10-CM | POA: Diagnosis not present

## 2019-03-12 DIAGNOSIS — R29810 Facial weakness: Secondary | ICD-10-CM | POA: Diagnosis not present

## 2019-03-12 LAB — CBC WITH DIFFERENTIAL/PLATELET
Abs Immature Granulocytes: 0.03 10*3/uL (ref 0.00–0.07)
Basophils Absolute: 0.1 10*3/uL (ref 0.0–0.1)
Basophils Relative: 1 %
Eosinophils Absolute: 0.3 10*3/uL (ref 0.0–0.5)
Eosinophils Relative: 4 %
HCT: 33 % — ABNORMAL LOW (ref 39.0–52.0)
Hemoglobin: 10.4 g/dL — ABNORMAL LOW (ref 13.0–17.0)
Immature Granulocytes: 0 %
Lymphocytes Relative: 12 %
Lymphs Abs: 0.9 10*3/uL (ref 0.7–4.0)
MCH: 30.1 pg (ref 26.0–34.0)
MCHC: 31.5 g/dL (ref 30.0–36.0)
MCV: 95.7 fL (ref 80.0–100.0)
Monocytes Absolute: 0.6 10*3/uL (ref 0.1–1.0)
Monocytes Relative: 7 %
Neutro Abs: 6 10*3/uL (ref 1.7–7.7)
Neutrophils Relative %: 76 %
Platelets: 268 10*3/uL (ref 150–400)
RBC: 3.45 MIL/uL — ABNORMAL LOW (ref 4.22–5.81)
RDW: 12.6 % (ref 11.5–15.5)
WBC: 7.8 10*3/uL (ref 4.0–10.5)
nRBC: 0 % (ref 0.0–0.2)

## 2019-03-12 LAB — COMPREHENSIVE METABOLIC PANEL
ALT: 11 U/L (ref 0–44)
AST: 11 U/L — ABNORMAL LOW (ref 15–41)
Albumin: 3.3 g/dL — ABNORMAL LOW (ref 3.5–5.0)
Alkaline Phosphatase: 113 U/L (ref 38–126)
Anion gap: 9 (ref 5–15)
BUN: 26 mg/dL — ABNORMAL HIGH (ref 8–23)
CO2: 23 mmol/L (ref 22–32)
Calcium: 8.9 mg/dL (ref 8.9–10.3)
Chloride: 111 mmol/L (ref 98–111)
Creatinine, Ser: 0.97 mg/dL (ref 0.61–1.24)
GFR calc Af Amer: >60 mL/min (ref >60)
GFR calc non Af Amer: >60 mL/min (ref >60)
Glucose, Bld: 125 mg/dL — ABNORMAL HIGH (ref 70–99)
Potassium: 3.8 mmol/L (ref 3.5–5.1)
Sodium: 143 mmol/L (ref 135–145)
Total Bilirubin: 0.3 mg/dL (ref 0.3–1.2)
Total Protein: 6 g/dL — ABNORMAL LOW (ref 6.5–8.1)

## 2019-03-12 LAB — URINALYSIS, ROUTINE W REFLEX MICROSCOPIC
Bilirubin Urine: NEGATIVE
Glucose, UA: NEGATIVE mg/dL
Hgb urine dipstick: NEGATIVE
Ketones, ur: NEGATIVE mg/dL
Leukocytes,Ua: NEGATIVE
Nitrite: NEGATIVE
Protein, ur: NEGATIVE mg/dL
Specific Gravity, Urine: 1.019 (ref 1.005–1.030)
pH: 6 (ref 5.0–8.0)

## 2019-03-12 MED ORDER — SODIUM CHLORIDE 0.9 % IV SOLN
INTRAVENOUS | Status: DC
  Administered 2019-03-12: 08:00:00 via INTRAVENOUS

## 2019-03-12 NOTE — ED Notes (Signed)
Patient verbalizes understanding of discharge instructions. Opportunity for questioning and answers were provided. Armband removed by staff, pt discharged from ED.  

## 2019-03-12 NOTE — ED Triage Notes (Signed)
Pt BIB [facility] d/t fall from bedside commode. Pt found supine. Per wife states weakness BLE. Pt AxOx1; oriented to self  Hx Dementia

## 2019-03-12 NOTE — ED Notes (Signed)
Pt is not steady on feet. When standing up pt pt was shaking a lot so I was unable to walk pt due to not feeling comfortable walking him. With and unsteady balance.

## 2019-03-12 NOTE — ED Provider Notes (Signed)
[facility] EMERGENCY DEPARTMENT Provider Note   CSN: [id number] Arrival date & time:        History   Chief Complaint Chief Complaint  Patient presents with  . Fall  . weakness    HPI Thomas Bridges is a 73 y.o. male.     73 year old male with history of dementia presents after witnessed fall by his wife.  All of the history as per EMS he says the patient is at his baseline according to the wife.  Has had decreased p.o. intake times several weeks.  Was sitting on the commode and fell to the side.  No reported LOC.  No recent medication changes.  No recent fever, cough, congestion.  Wife did notice some questionable facial drooping which is different than his normal baseline from his prior stroke.  EMS was called and CBG was 179.  Patient's vital signs are stable and he was transported here for further management.     Past Medical History:  Diagnosis Date  . Chest pain   . HTN (hypertension)   . SOB (shortness of breath)     There are no active problems to display for this patient.   Past Surgical History:  Procedure Laterality Date  . CARDIAC CATHETERIZATION          Home Medications    Prior to Admission medications   Not on File    Family History No family history on file.  Social History Social History   Tobacco Use  . Smoking status: Never Smoker  . Smokeless tobacco: Never Used  Substance Use Topics  . Alcohol use: No  . Drug use: No     Allergies   Patient has no known allergies.   Review of Systems Review of Systems  Unable to perform ROS: Dementia     Physical Exam Updated Vital Signs SpO2 99%   Physical Exam Vitals signs and nursing note reviewed.  Constitutional:      General: He is not in acute distress.    Appearance: Normal appearance. He is well-developed. He is not toxic-appearing.  HENT:     Head: Normocephalic and atraumatic.  Eyes:     General: Lids are normal.     Conjunctiva/sclera:  Conjunctivae normal.     Pupils: Pupils are equal, round, and reactive to light.  Neck:     Musculoskeletal: Normal range of motion and neck supple.     Thyroid: No thyroid mass.     Trachea: No tracheal deviation.  Cardiovascular:     Rate and Rhythm: Normal rate and regular rhythm.     Heart sounds: Normal heart sounds. No murmur. No gallop.   Pulmonary:     Effort: Pulmonary effort is normal. No respiratory distress.     Breath sounds: Normal breath sounds. No stridor. No decreased breath sounds, wheezing, rhonchi or rales.  Abdominal:     General: Bowel sounds are normal. There is no distension.     Palpations: Abdomen is soft.     Tenderness: There is no abdominal tenderness. There is no rebound.  Musculoskeletal: Normal range of motion.        General: No tenderness.       Legs:  Skin:    General: Skin is warm and dry.     Findings: No abrasion or rash.  Neurological:     Mental Status: He is alert.     GCS: GCS eye subscore is 4. GCS verbal subscore is 4. GCS motor  subscore is 6.     Cranial Nerves: No cranial nerve deficit.     Sensory: No sensory deficit.     Comments: Strength is 5 of 5 in all 4 extremities.  Psychiatric:        Attention and Perception: He is inattentive.      ED Treatments / Results  Labs (all labs ordered are listed, but only abnormal results are displayed) Labs Reviewed  URINE CULTURE  CBC WITH DIFFERENTIAL/PLATELET  COMPREHENSIVE METABOLIC PANEL  URINALYSIS, ROUTINE W REFLEX MICROSCOPIC    EKG    Radiology No results found.  Procedures Procedures (including critical care time)  Medications Ordered in ED Medications  0.9 %  sodium chloride infusion (has no administration in time range)     Initial Impression / Assessment and Plan / ED Course  I have reviewed the triage vital signs and the nursing notes.  Pertinent labs & imaging results that were available during my care of the patient were reviewed by me and considered in  my medical decision making (see chart for details).        Work-up here including urinalysis, head CT and labs without acute findings.  Long discussion with wife and patient has gradually been declining over several weeks.  She is going to arrange home health and patient to be discharged  Final Clinical Impressions(s) / ED Diagnoses   Final diagnoses:  None    ED Discharge Orders    None       Lacretia Leigh, MD 03/12/19 1319

## 2019-03-13 LAB — URINE CULTURE: Culture: NO GROWTH

## 2019-03-27 DIAGNOSIS — Z20828 Contact with and (suspected) exposure to other viral communicable diseases: Secondary | ICD-10-CM | POA: Diagnosis not present

## 2019-04-02 DIAGNOSIS — Z20828 Contact with and (suspected) exposure to other viral communicable diseases: Secondary | ICD-10-CM | POA: Diagnosis not present

## 2019-04-06 DIAGNOSIS — R509 Fever, unspecified: Secondary | ICD-10-CM | POA: Diagnosis not present

## 2019-04-06 DIAGNOSIS — R05 Cough: Secondary | ICD-10-CM | POA: Diagnosis not present

## 2019-04-09 ENCOUNTER — Emergency Department (HOSPITAL_COMMUNITY): Payer: Medicare HMO

## 2019-04-09 ENCOUNTER — Inpatient Hospital Stay (HOSPITAL_COMMUNITY)
Admission: EM | Admit: 2019-04-09 | Discharge: 2019-05-02 | DRG: 871 | Disposition: E | Payer: Medicare HMO | Attending: Family Medicine | Admitting: Family Medicine

## 2019-04-09 ENCOUNTER — Encounter (HOSPITAL_COMMUNITY): Payer: Self-pay

## 2019-04-09 DIAGNOSIS — F0390 Unspecified dementia without behavioral disturbance: Secondary | ICD-10-CM | POA: Diagnosis not present

## 2019-04-09 DIAGNOSIS — E870 Hyperosmolality and hypernatremia: Secondary | ICD-10-CM | POA: Diagnosis present

## 2019-04-09 DIAGNOSIS — R05 Cough: Secondary | ICD-10-CM | POA: Diagnosis not present

## 2019-04-09 DIAGNOSIS — A419 Sepsis, unspecified organism: Secondary | ICD-10-CM

## 2019-04-09 DIAGNOSIS — J189 Pneumonia, unspecified organism: Secondary | ICD-10-CM | POA: Diagnosis not present

## 2019-04-09 DIAGNOSIS — J1289 Other viral pneumonia: Secondary | ICD-10-CM | POA: Diagnosis present

## 2019-04-09 DIAGNOSIS — Z87440 Personal history of urinary (tract) infections: Secondary | ICD-10-CM

## 2019-04-09 DIAGNOSIS — A4189 Other specified sepsis: Principal | ICD-10-CM | POA: Diagnosis present

## 2019-04-09 DIAGNOSIS — Z515 Encounter for palliative care: Secondary | ICD-10-CM | POA: Diagnosis not present

## 2019-04-09 DIAGNOSIS — R569 Unspecified convulsions: Secondary | ICD-10-CM | POA: Diagnosis present

## 2019-04-09 DIAGNOSIS — I10 Essential (primary) hypertension: Secondary | ICD-10-CM | POA: Diagnosis present

## 2019-04-09 DIAGNOSIS — Z66 Do not resuscitate: Secondary | ICD-10-CM | POA: Diagnosis not present

## 2019-04-09 DIAGNOSIS — J181 Lobar pneumonia, unspecified organism: Secondary | ICD-10-CM

## 2019-04-09 DIAGNOSIS — R55 Syncope and collapse: Secondary | ICD-10-CM | POA: Diagnosis not present

## 2019-04-09 DIAGNOSIS — R5381 Other malaise: Secondary | ICD-10-CM | POA: Diagnosis not present

## 2019-04-09 DIAGNOSIS — U071 COVID-19: Secondary | ICD-10-CM | POA: Diagnosis not present

## 2019-04-09 DIAGNOSIS — R4182 Altered mental status, unspecified: Secondary | ICD-10-CM | POA: Diagnosis not present

## 2019-04-09 DIAGNOSIS — G9341 Metabolic encephalopathy: Secondary | ICD-10-CM | POA: Diagnosis not present

## 2019-04-09 DIAGNOSIS — Z7982 Long term (current) use of aspirin: Secondary | ICD-10-CM

## 2019-04-09 DIAGNOSIS — N179 Acute kidney failure, unspecified: Secondary | ICD-10-CM | POA: Diagnosis not present

## 2019-04-09 DIAGNOSIS — D638 Anemia in other chronic diseases classified elsewhere: Secondary | ICD-10-CM | POA: Diagnosis present

## 2019-04-09 DIAGNOSIS — J159 Unspecified bacterial pneumonia: Secondary | ICD-10-CM | POA: Diagnosis present

## 2019-04-09 DIAGNOSIS — R627 Adult failure to thrive: Secondary | ICD-10-CM | POA: Diagnosis present

## 2019-04-09 DIAGNOSIS — Z79899 Other long term (current) drug therapy: Secondary | ICD-10-CM

## 2019-04-09 DIAGNOSIS — R404 Transient alteration of awareness: Secondary | ICD-10-CM | POA: Diagnosis not present

## 2019-04-09 DIAGNOSIS — I959 Hypotension, unspecified: Secondary | ICD-10-CM | POA: Diagnosis not present

## 2019-04-09 LAB — CBC WITH DIFFERENTIAL/PLATELET
Abs Immature Granulocytes: 0 10*3/uL (ref 0.00–0.07)
Basophils Absolute: 0 10*3/uL (ref 0.0–0.1)
Basophils Relative: 0 %
Eosinophils Absolute: 0 10*3/uL (ref 0.0–0.5)
Eosinophils Relative: 0 %
HCT: 33.2 % — ABNORMAL LOW (ref 39.0–52.0)
Hemoglobin: 10.6 g/dL — ABNORMAL LOW (ref 13.0–17.0)
Lymphocytes Relative: 6 %
Lymphs Abs: 0.3 10*3/uL — ABNORMAL LOW (ref 0.7–4.0)
MCH: 30.1 pg (ref 26.0–34.0)
MCHC: 31.9 g/dL (ref 30.0–36.0)
MCV: 94.3 fL (ref 80.0–100.0)
Monocytes Absolute: 0.1 10*3/uL (ref 0.1–1.0)
Monocytes Relative: 3 %
Neutro Abs: 4.5 10*3/uL (ref 1.7–7.7)
Neutrophils Relative %: 91 %
Platelets: 177 10*3/uL (ref 150–400)
RBC: 3.52 MIL/uL — ABNORMAL LOW (ref 4.22–5.81)
RDW: 13.8 % (ref 11.5–15.5)
WBC: 4.9 10*3/uL (ref 4.0–10.5)
nRBC: 0 % (ref 0.0–0.2)
nRBC: 0 /100 WBC

## 2019-04-09 LAB — APTT: aPTT: 48 seconds — ABNORMAL HIGH (ref 24–36)

## 2019-04-09 LAB — URINALYSIS, ROUTINE W REFLEX MICROSCOPIC
Bilirubin Urine: NEGATIVE
Glucose, UA: NEGATIVE mg/dL
Hgb urine dipstick: NEGATIVE
Ketones, ur: NEGATIVE mg/dL
Leukocytes,Ua: NEGATIVE
Nitrite: NEGATIVE
Protein, ur: 100 mg/dL — AB
Specific Gravity, Urine: 1.026 (ref 1.005–1.030)
pH: 5 (ref 5.0–8.0)

## 2019-04-09 LAB — COMPREHENSIVE METABOLIC PANEL
ALT: 35 U/L (ref 0–44)
AST: 37 U/L (ref 15–41)
Albumin: 2.7 g/dL — ABNORMAL LOW (ref 3.5–5.0)
Alkaline Phosphatase: 106 U/L (ref 38–126)
Anion gap: 11 (ref 5–15)
BUN: 25 mg/dL — ABNORMAL HIGH (ref 8–23)
CO2: 21 mmol/L — ABNORMAL LOW (ref 22–32)
Calcium: 8.2 mg/dL — ABNORMAL LOW (ref 8.9–10.3)
Chloride: 109 mmol/L (ref 98–111)
Creatinine, Ser: 1.26 mg/dL — ABNORMAL HIGH (ref 0.61–1.24)
GFR calc Af Amer: >60 mL/min (ref >60)
GFR calc non Af Amer: 56 mL/min — ABNORMAL LOW (ref >60)
Glucose, Bld: 129 mg/dL — ABNORMAL HIGH (ref 70–99)
Potassium: 3.8 mmol/L (ref 3.5–5.1)
Sodium: 141 mmol/L (ref 135–145)
Total Bilirubin: 0.4 mg/dL (ref 0.3–1.2)
Total Protein: 5.9 g/dL — ABNORMAL LOW (ref 6.5–8.1)

## 2019-04-09 LAB — SEDIMENTATION RATE: Sed Rate: 35 mm/hr — ABNORMAL HIGH (ref 0–16)

## 2019-04-09 LAB — C-REACTIVE PROTEIN: CRP: 13.7 mg/dL — ABNORMAL HIGH (ref <1.0)

## 2019-04-09 LAB — LACTIC ACID, PLASMA: Lactic Acid, Venous: 1.7 mmol/L (ref 0.5–1.9)

## 2019-04-09 LAB — SARS CORONAVIRUS 2 BY RT PCR (HOSPITAL ORDER, PERFORMED IN ~~LOC~~ HOSPITAL LAB): SARS Coronavirus 2: POSITIVE — AB

## 2019-04-09 LAB — PROCALCITONIN: Procalcitonin: 1.18 ng/mL

## 2019-04-09 LAB — FIBRINOGEN: Fibrinogen: 557 mg/dL — ABNORMAL HIGH (ref 210–475)

## 2019-04-09 LAB — PROTIME-INR
INR: 1.1 (ref 0.8–1.2)
Prothrombin Time: 14.2 seconds (ref 11.4–15.2)

## 2019-04-09 LAB — D-DIMER, QUANTITATIVE: D-Dimer, Quant: 0.99 ug/mL-FEU — ABNORMAL HIGH (ref 0.00–0.50)

## 2019-04-09 LAB — FERRITIN: Ferritin: 313 ng/mL (ref 24–336)

## 2019-04-09 LAB — CBG MONITORING, ED: Glucose-Capillary: 133 mg/dL — ABNORMAL HIGH (ref 70–99)

## 2019-04-09 MED ORDER — SODIUM CHLORIDE 0.9 % IV SOLN
200.0000 mg | Freq: Once | INTRAVENOUS | Status: AC
  Administered 2019-04-09: 200 mg via INTRAVENOUS
  Filled 2019-04-09: qty 40

## 2019-04-09 MED ORDER — SODIUM CHLORIDE 0.9 % IV SOLN
500.0000 mg | INTRAVENOUS | Status: AC
  Administered 2019-04-09 – 2019-04-13 (×5): 500 mg via INTRAVENOUS
  Filled 2019-04-09 (×5): qty 500

## 2019-04-09 MED ORDER — SODIUM CHLORIDE 0.9 % IV SOLN
100.0000 mg | INTRAVENOUS | Status: AC
  Administered 2019-04-10 – 2019-04-13 (×4): 100 mg via INTRAVENOUS
  Filled 2019-04-09 (×4): qty 20

## 2019-04-09 MED ORDER — METHYLPREDNISOLONE SODIUM SUCC 125 MG IJ SOLR
60.0000 mg | Freq: Four times a day (QID) | INTRAMUSCULAR | Status: DC
  Administered 2019-04-09 – 2019-04-10 (×3): 60 mg via INTRAVENOUS
  Filled 2019-04-09 (×3): qty 2

## 2019-04-09 MED ORDER — DONEPEZIL HCL 10 MG PO TABS
10.0000 mg | ORAL_TABLET | Freq: Every day | ORAL | Status: DC
  Administered 2019-04-09 – 2019-04-10 (×2): 10 mg via ORAL
  Filled 2019-04-09 (×2): qty 1

## 2019-04-09 MED ORDER — SODIUM CHLORIDE 0.9 % IV BOLUS
1000.0000 mL | Freq: Once | INTRAVENOUS | Status: AC
  Administered 2019-04-09: 1000 mL via INTRAVENOUS

## 2019-04-09 MED ORDER — VANCOMYCIN HCL 10 G IV SOLR
1500.0000 mg | Freq: Once | INTRAVENOUS | Status: AC
  Administered 2019-04-09: 1500 mg via INTRAVENOUS
  Filled 2019-04-09: qty 1500

## 2019-04-09 MED ORDER — ENOXAPARIN SODIUM 40 MG/0.4ML ~~LOC~~ SOLN
40.0000 mg | SUBCUTANEOUS | Status: DC
  Administered 2019-04-09 – 2019-04-13 (×5): 40 mg via SUBCUTANEOUS
  Filled 2019-04-09 (×5): qty 0.4

## 2019-04-09 MED ORDER — SODIUM CHLORIDE 0.9 % IV SOLN
1.0000 g | INTRAVENOUS | Status: AC
  Administered 2019-04-09 – 2019-04-13 (×5): 1 g via INTRAVENOUS
  Filled 2019-04-09 (×5): qty 10

## 2019-04-09 MED ORDER — SODIUM CHLORIDE 0.9 % IV SOLN
2.0000 g | Freq: Once | INTRAVENOUS | Status: AC
  Administered 2019-04-09: 2 g via INTRAVENOUS
  Filled 2019-04-09: qty 2

## 2019-04-09 MED ORDER — VANCOMYCIN HCL IN DEXTROSE 1-5 GM/200ML-% IV SOLN: 1000.0000 mg | Freq: Once | INTRAVENOUS | Status: DC

## 2019-04-09 MED ORDER — ZINC SULFATE 220 (50 ZN) MG PO CAPS
220.0000 mg | ORAL_CAPSULE | Freq: Every day | ORAL | Status: DC
  Administered 2019-04-10 – 2019-04-12 (×3): 220 mg via ORAL
  Filled 2019-04-09 (×4): qty 1

## 2019-04-09 MED ORDER — VITAMIN C 500 MG PO TABS
500.0000 mg | ORAL_TABLET | Freq: Every day | ORAL | Status: DC
  Administered 2019-04-10 – 2019-04-12 (×3): 500 mg via ORAL
  Filled 2019-04-09 (×4): qty 1

## 2019-04-09 NOTE — Progress Notes (Addendum)
Pt wife [name] updated on status of pt.  2240  Spoke with wife [name] updated on pt status.

## 2019-04-09 NOTE — ED Notes (Signed)
Doug([facility]) called @ 1714-per Arby Barrette, RN called by Levada Dy

## 2019-04-09 NOTE — H&P (Signed)
History and Physical:    Thomas Bridges [facility]   SLH:[id number] DOB: [birth year] DOA: 04/15/2019  Referring MD/provider: Haynes Bridges PCP: Thomas Huddle, MD   Patient coming from: Home  Chief Complaint:  Fever.  History is entirely per patient's wife as patient is unable to provide a history Patient's wife is Thomas Bridges  [phone] (home)  [phone] (cell)  History of Present Illness:   Thomas Bridges is an 73 y.o. male with past medical history significant for advanced dementia who was noted to be febrile by his wife approximately 4 days ago ago along with a decrease in his usual functionality.  At baseline patient needs assistance walking with a walker.  Uses a bedside commode and needs help feeding himself.  He intermittently recognizes his wife and apparently sometimes calls out for his parents at nighttime.   As patient usually gets UTIs she called her PCP who prescribed Levaquin empirically.  However patient's wife noted that he did not defervesce and fevers continued to 103 to 104.  Yesterday patient's wife noted episodes where patient became stiff and unresponsive when his fevers were very high.  He subsequently relaxed after the fevers had been treated with Tylenol and cool compresses and patient was sent to the ED.  Patient's wife notes that he has not had a good meal in 2 to 3 days.  She has been trying to force him to drink fluids.  She notes that he spent the last 3 days lying in bed without getting up which is entirely unusual for him.  She also notes that at baseline he can answer questions in 1-2 word sentences however has not spoken intelligibly for the past 3 days.  ED Course:  The patient was noted to be febrile to 100.5 and hypotensive.  COVID test came back positive.  X-ray revealed right basilar infiltrate.  Procalcitonin is elevated at 1.18.  Patient was treated with cefepime and vancomycin empirically.  Pretension responded well to IV fluid resuscitation.  ROS:    ROS   Review of Systems: Patient unable to provide accurate review of systems.  Past Medical History:   Past Medical History:  Diagnosis Date  . Chest pain   . HTN (hypertension)   . SOB (shortness of breath)     Past Surgical History:   Past Surgical History:  Procedure Laterality Date  . CARDIAC CATHETERIZATION      Social History:   Social History   Socioeconomic History  . Marital status: Single    Spouse name: Not on file  . Number of children: Not on file  . Years of education: Not on file  . Highest education level: Not on file  Occupational History  . Not on file  Social Needs  . Financial resource strain: Not on file  . Food insecurity    Worry: Not on file    Inability: Not on file  . Transportation needs    Medical: Not on file    Non-medical: Not on file  Tobacco Use  . Smoking status: Never Smoker  . Smokeless tobacco: Never Used  Substance and Sexual Activity  . Alcohol use: No  . Drug use: No  . Sexual activity: Not on file  Lifestyle  . Physical activity    Days per week: Not on file    Minutes per session: Not on file  . Stress: Not on file  Relationships  . Social connections    Talks on phone: Not on file  Gets together: Not on file    Attends religious service: Not on file    Active member of club or organization: Not on file    Attends meetings of clubs or organizations: Not on file    Relationship status: Not on file  . Intimate partner violence    Fear of current or ex partner: Not on file    Emotionally abused: Not on file    Physically abused: Not on file    Forced sexual activity: Not on file  Other Topics Concern  . Not on file  Social History Narrative  . Not on file    Allergies   Patient has no known allergies.  Family history:   History reviewed. No pertinent family history.  Current Medications:   Prior to Admission medications   Medication Sig Start Date End Date Taking? Authorizing Provider   aspirin EC 325 MG tablet Take 325 mg by mouth daily.   Yes [provider]  donepezil (ARICEPT) 10 MG tablet Take 10 mg by mouth at bedtime. 03/02/19  Yes [provider]  furosemide (LASIX) 20 MG tablet Take 20 mg by mouth daily as needed for fluid. 03/28/19  Yes [provider]  levofloxacin (LEVAQUIN) 500 MG tablet Take 500 mg by mouth daily. For 7 days Started on 7.6.20 04/06/19  Yes [provider]  Multiple Vitamins-Minerals (PRESERVISION AREDS 2) CAPS Take 1 tablet by mouth daily.   Yes [provider]    Physical Exam:   Vitals:   04/18/2019 1546 04/12/2019 1552 04/14/2019 1815 04/22/2019 1830  BP:  (!) 145/76 (!) 110/55 (!) 126/59  Pulse: (!) 59 75 (!) 52 (!) 56  Resp: (!) 22 19 (!) 23 18  Temp:      TempSrc:      SpO2: 100% 99% 94% 100%     Physical Exam: Blood pressure (!) 126/59, pulse (!) 56, temperature (!) 100.5 F (38.1 C), temperature source Rectal, resp. rate 18, SpO2 100 %. Gen: Chronically ill-appearing man looking much older than stated age lying flat in bed in no acute respiratory distress. Eyes: Sclerae anicteric.  Chest: Moderately good air entry bilaterally with no adventitious sounds.  CV: Distant, regular, no audible murmurs. Abdomen: NABS, soft, nondistended, nontender. Extremities: No edema.   Data Review:    Labs: Basic Metabolic Panel: Recent Labs  Lab 04/26/2019 1123  NA 141  K 3.8  CL 109  CO2 21*  GLUCOSE 129*  BUN 25*  CREATININE 1.26*  CALCIUM 8.2*   Liver Function Tests: Recent Labs  Lab 04/18/2019 1123  AST 37  ALT 35  ALKPHOS 106  BILITOT 0.4  PROT 5.9*  ALBUMIN 2.7*   No results for input(s): LIPASE, AMYLASE in the last 168 hours. No results for input(s): AMMONIA in the last 168 hours. CBC: Recent Labs  Lab 04/01/2019 1123  WBC 4.9  NEUTROABS 4.5  HGB 10.6*  HCT 33.2*  MCV 94.3  PLT 177   Cardiac Enzymes: No results for input(s): CKTOTAL, CKMB, CKMBINDEX, TROPONINI in the last  168 hours.  BNP (last 3 results) No results for input(s): PROBNP in the last 8760 hours. CBG: Recent Labs  Lab 04/30/2019 1109  GLUCAP 133*    Urinalysis    Component Value Date/Time   COLORURINE YELLOW 04/04/2019 1123   APPEARANCEUR HAZY (A) 04/25/2019 1123   LABSPEC 1.026 04/19/2019 1123   PHURINE 5.0 04/04/2019 1123   GLUCOSEU NEGATIVE 04/16/2019 1123   HGBUR NEGATIVE 04/19/2019 1123   BILIRUBINUR  NEGATIVE 04/17/2019 1123   KETONESUR NEGATIVE 04/19/2019 1123   PROTEINUR 100 (A) 04/22/2019 1123   NITRITE NEGATIVE 04/04/2019 1123   LEUKOCYTESUR NEGATIVE 04/20/2019 1123      Radiographic Studies: Ct Head Wo Contrast  Result Date: 04/26/2019 CLINICAL DATA:  73 year old male with seizure. Altered level of consciousness. EXAM: CT HEAD WITHOUT CONTRAST TECHNIQUE: Contiguous axial images were obtained from the base of the skull through the vertex without intravenous contrast. COMPARISON:  03/12/2019 FINDINGS: Brain: No evidence of acute infarction, hemorrhage, hydrocephalus, extra-axial collection or mass lesion/mass effect. Mild atrophy and mild chronic small-vessel white matter ischemic changes again noted. Vascular: Carotid atherosclerotic calcifications noted Skull: Normal. Negative for fracture or focal lesion. Sinuses/Orbits: Small amount of fluid/mucosal thickening within the LEFT sphenoid sinus noted. Other: None IMPRESSION: 1. No evidence of acute intracranial abnormality. 2. Mild atrophy and chronic small-vessel white matter ischemic changes. 3. Small amount of fluid/mucosal thickening within the LEFT sphenoid sinus which may represent acute sinusitis. Electronically Signed   By: Margarette Canada M.D.   On: 04/16/2019 12:58   Dg Chest Portable 1 View  Result Date: 04/05/2019 CLINICAL DATA:  Cough, fever and altered mental status. EXAM: PORTABLE CHEST 1 VIEW COMPARISON:  03/12/2019 FINDINGS: The cardiac silhouette, mediastinal and hilar contours are within normal limits and stable.  Vague airspace opacity noted in the right lower lobe in the posterior costophrenic sulcus, suspicious for infiltrate. The left lung is clear. The bony thorax is intact. IMPRESSION: Suspect right basilar infiltrate. Electronically Signed   By: Marijo Sanes M.D.   On: 04/06/2019 11:43    EKG: Independently reviewed.  Sinus rhythm at 66.  Nonspecific intraventricular conduction delay.  Axis -60.  No acute ST-T wave changes.   Assessment/Plan:   Active Problems:   Pneumonia due to COVID-19 virus   CAP (community acquired pneumonia)   AKI (acute kidney injury) ([place])   Dementia ([place])  73 year old man with advanced dementia presents with COVID-19 infection as well as an elevated procalcitonin with right basilar infiltrate.  He may also have an acute sinusitis.  COVID 19 Started treatment with Solu-Medrol Remdesivir requested Oxygen as warranted  CAP Ceftriaxone and azithromycin ordered.  HYPOTENSION Has responded well IV fluid resuscitation  AKI Should respond well to treatment of infection and of SARS-CoV-2.  PATIENT IS DNR  Other information:   DVT prophylaxis: Lovenox ordered. Code Status: DNR Family Communication: Long discussion with patient's wife Jurell Basista, phone number.[phone] (home)  Disposition Plan: Home probably Consults called: None Admission status: Inpatient  The medical decision making on this patient was of high complexity and the patient is at high risk for clinical deterioration, therefore this is a level 3 visit.    Dewaine Oats Tublu Chatterjee [facility]  If 7PM-7AM, please contact night-coverage [email or web address] Password [facility] 04/14/2019, 7:38 PM

## 2019-04-09 NOTE — Progress Notes (Signed)
Patient's wife is Damire Remedios  [phone] (home)  [phone] (cell)

## 2019-04-09 NOTE — ED Notes (Signed)
[facility] just left with pt in transport to [facility].

## 2019-04-09 NOTE — Progress Notes (Signed)
Pharmacy Brief Note  O:  ALT: 35 CXR: possible right basilar infiltrate SpO2: as low as 93 % on RA  A/P:  Patient meets criteria for remdesevir therapy. Will initiate remdesivir 200 mg iv once followed by 100 mg iv daily x 4 days. Will f/u ALT.   Napoleon Form, [facility] 04/26/2019 8:35 PM

## 2019-04-09 NOTE — ED Provider Notes (Signed)
[place] EMERGENCY DEPARTMENT Provider Note   CSN: [id number] Arrival date & time: 04/08/2019  1101   History   Chief Complaint Chief Complaint  Patient presents with  . Possible Sepsis    HPI Thomas Bridges is a 73 y.o. male with history of dementia, hypertension resenting via EMS.  History obtained from wife [name] via telephone number [phone]  She reports he has history of dementia normally alert to self and place and can maintain short conversations.  Patient fever beginning last weekend, 04/04/2019, max fever 103F, held telehealth visit with primary care provider diagnosed with bacterial pneumonia and started on levofloxacin has been taking twice daily since 04/06/2019, intermittent Tylenol use.  Fever, lethargy has continued, wife reports that patient has had a total of 3 "febrile seizures" since yesterday she reports that the patient's arms and legs locked up and shake he has a brief period of unresponsiveness, last episode of this was 6 AM this morning.     HPI  Past Medical History:  Diagnosis Date  . Chest pain   . HTN (hypertension)   . SOB (shortness of breath)     There are no active problems to display for this patient.   Past Surgical History:  Procedure Laterality Date  . CARDIAC CATHETERIZATION          Home Medications    Prior to Admission medications   Medication Sig Start Date End Date Taking? Authorizing Provider  aspirin EC 325 MG tablet Take 325 mg by mouth daily.   Yes [provider]  donepezil (ARICEPT) 10 MG tablet Take 10 mg by mouth at bedtime. 03/02/19  Yes [provider]  furosemide (LASIX) 20 MG tablet Take 20 mg by mouth daily as needed for fluid. 03/28/19  Yes [provider]  levofloxacin (LEVAQUIN) 500 MG tablet Take 500 mg by mouth daily. For 7 days Started on 7.6.20 04/06/19  Yes [provider]  Multiple Vitamins-Minerals (PRESERVISION AREDS 2) CAPS Take 1 tablet by mouth  daily.   Yes [provider]    Family History History reviewed. No pertinent family history.  Social History Social History   Tobacco Use  . Smoking status: Never Smoker  . Smokeless tobacco: Never Used  Substance Use Topics  . Alcohol use: No  . Drug use: No     Allergies   Patient has no known allergies.   Review of Systems Review of Systems  Unable to perform ROS: Dementia   Physical Exam Updated Vital Signs BP 126/72   Pulse (!) 57   Temp (!) 100.5 F (38.1 C) (Rectal)   Resp 17   SpO2 99%   Physical Exam Constitutional:      General: He is not in acute distress.    Appearance: Normal appearance. He is well-developed. He is not ill-appearing or diaphoretic.  HENT:     Head: Normocephalic and atraumatic.     Right Ear: External ear normal.     Left Ear: External ear normal.     Nose: Nose normal.  Eyes:     General: Vision grossly intact. Gaze aligned appropriately.     Pupils: Pupils are equal, round, and reactive to light.  Neck:     Musculoskeletal: Normal range of motion.     Trachea: Trachea and phonation normal. No tracheal deviation.  Cardiovascular:     Rate and Rhythm: Normal rate and regular rhythm.     Pulses: Normal pulses.     Heart sounds:  Normal heart sounds.  Pulmonary:     Effort: Pulmonary effort is normal. No accessory muscle usage or respiratory distress.     Breath sounds: Normal breath sounds and air entry.  Chest:     Chest wall: No deformity, tenderness or crepitus.  Abdominal:     General: There is no distension.     Palpations: Abdomen is soft.     Tenderness: There is no abdominal tenderness. There is no guarding or rebound.  Musculoskeletal: Normal range of motion.  Skin:    General: Skin is warm and dry.  Neurological:     Mental Status: He is alert. Mental status is at baseline. He is confused.     GCS: GCS eye subscore is 4. GCS verbal subscore is 4.     Comments: Arousable to voice, moving bilateral  extremities spontaneously  Psychiatric:        Behavior: Behavior normal.     ED Treatments / Results  Labs (all labs ordered are listed, but only abnormal results are displayed) Labs Reviewed  SARS CORONAVIRUS 2 (HOSPITAL ORDER, [place] [facility]) - Abnormal; Notable for the following components:      Result Value   SARS Coronavirus 2 POSITIVE (*)    All other components within normal limits  COMPREHENSIVE METABOLIC PANEL - Abnormal; Notable for the following components:   CO2 21 (*)    Glucose, Bld 129 (*)    BUN 25 (*)    Creatinine, Ser 1.26 (*)    Calcium 8.2 (*)    Total Protein 5.9 (*)    Albumin 2.7 (*)    GFR calc non Af Amer 56 (*)    All other components within normal limits  CBC WITH DIFFERENTIAL/PLATELET - Abnormal; Notable for the following components:   RBC 3.52 (*)    Hemoglobin 10.6 (*)    HCT 33.2 (*)    Lymphs Abs 0.3 (*)    All other components within normal limits  APTT - Abnormal; Notable for the following components:   aPTT 48 (*)    All other components within normal limits  URINALYSIS, ROUTINE W REFLEX MICROSCOPIC - Abnormal; Notable for the following components:   APPearance HAZY (*)    Protein, ur 100 (*)    Bacteria, UA RARE (*)    All other components within normal limits  CBG MONITORING, ED - Abnormal; Notable for the following components:   Glucose-Capillary 133 (*)    All other components within normal limits  CULTURE, BLOOD (ROUTINE X 2)  CULTURE, BLOOD (ROUTINE X 2)  URINE CULTURE  LACTIC ACID, PLASMA  PROTIME-INR  LACTIC ACID, PLASMA  C-REACTIVE PROTEIN  SEDIMENTATION RATE  FERRITIN  PROCALCITONIN  D-DIMER, QUANTITATIVE (NOT AT [facility])  FIBRINOGEN    EKG EKG Interpretation  Date/Time:  Thursday April 09 2019 11:07:32 EDT Ventricular Rate:  66 PR Interval:    QRS Duration: 128 QT Interval:  420 QTC Calculation: 440 R Axis:   -55 Text Interpretation:  Sinus rhythm Nonspecific IVCD with LAD Left  ventricular hypertrophy Confirmed by Lacretia Leigh ([id number]) on 04/21/2019 11:37:54 AM   Radiology Ct Head Wo Contrast  Result Date: 04/18/2019 CLINICAL DATA:  73 year old male with seizure. Altered level of consciousness. EXAM: CT HEAD WITHOUT CONTRAST TECHNIQUE: Contiguous axial images were obtained from the base of the skull through the vertex without intravenous contrast. COMPARISON:  03/12/2019 FINDINGS: Brain: No evidence of acute infarction, hemorrhage, hydrocephalus, extra-axial collection or mass lesion/mass effect. Mild atrophy and mild  chronic small-vessel white matter ischemic changes again noted. Vascular: Carotid atherosclerotic calcifications noted Skull: Normal. Negative for fracture or focal lesion. Sinuses/Orbits: Small amount of fluid/mucosal thickening within the LEFT sphenoid sinus noted. Other: None IMPRESSION: 1. No evidence of acute intracranial abnormality. 2. Mild atrophy and chronic small-vessel white matter ischemic changes. 3. Small amount of fluid/mucosal thickening within the LEFT sphenoid sinus which may represent acute sinusitis. Electronically Signed   By: Margarette Canada M.D.   On: 05/01/2019 12:58   Dg Chest Portable 1 View  Result Date: 05/01/2019 CLINICAL DATA:  Cough, fever and altered mental status. EXAM: PORTABLE CHEST 1 VIEW COMPARISON:  03/12/2019 FINDINGS: The cardiac silhouette, mediastinal and hilar contours are within normal limits and stable. Vague airspace opacity noted in the right lower lobe in the posterior costophrenic sulcus, suspicious for infiltrate. The left lung is clear. The bony thorax is intact. IMPRESSION: Suspect right basilar infiltrate. Electronically Signed   By: Marijo Sanes M.D.   On: 04/03/2019 11:43    Procedures Procedures (including critical care time)  Medications Ordered in ED Medications  ceFEPIme (MAXIPIME) 2 g in sodium chloride 0.9 % 100 mL IVPB (0 g Intravenous Stopped 04/21/2019 1326)  sodium chloride 0.9 % bolus 1,000 mL (0 mLs  Intravenous Stopped 04/30/2019 1326)  vancomycin (VANCOCIN) 1,500 mg in sodium chloride 0.9 % 500 mL IVPB (1,500 mg Intravenous Bolus from Bag 04/28/2019 1152)  sodium chloride 0.9 % bolus 1,000 mL (1,000 mLs Intravenous Bolus from Bag 04/06/2019 1326)     Initial Impression / Assessment and Plan / ED Course  I have reviewed the triage vital signs and the nursing notes.  Pertinent labs & imaging results that were available during my care of the patient were reviewed by me and considered in my medical decision making (see chart for details).  Clinical Course as of Apr 08 1457  Thu Apr 09, 2019  [phone] [name] wife    [BM]  1357 Hospitalist   [BM]    Clinical Course User Index [BM] Deliah [place], [place]   EKG: Sinus rhythm Nonspecific IVCD with LAD Left ventricular hypertrophy Confirmed by Lacretia Leigh ([id number]) on 04/04/2019 11:37:54 AM CBC nonacute Lactic 1.7 PT/INR within normal limits APTT 48 CBG 133 Blood cultures pending CMP nonacute, had mild elevation in creatinine likely secondary to dehydration and infection Urinalysis with rare bacteria, nitrite negative, 0-5 WBCs, does not appear infectious CXR:  IMPRESSION:  Suspect right basilar infiltrate.   Patient's initial hypotension improved well following IV fluids.  Patient febrile and hypotensive on arrival currently being treated for bacterial pneumonia, code sepsis was initiated.  CT head was ordered as patient with new onset seizures.  CT Head: IMPRESSION:  1. No evidence of acute intracranial abnormality.  2. Mild atrophy and chronic small-vessel white matter ischemic  changes.  3. Small amount of fluid/mucosal thickening within the LEFT sphenoid  sinus which may represent acute sinusitis.  - Patient was started on cefepime and vancomycin for sepsis presumed pneumonia.  Consult called to hospitalist service for admission. - COVID-19 positive, patient is without hypoxia on room air  Discussed case with  hospitalist service for admission and they will be seeing patient here in the ED. I called patient's wife [name] back and informed her of patient's COVID-19 status, she was surprised as he has had 2 recent COVID-19 test which were both negative.  I discussed at length the diagnosis with [name] and she stated understanding, I advised that she practice self isolation and  good quarantine practices and call her primary care provider today to inform them of her husband's diagnosis and that she will need follow-up herself she stated understanding, I advised the same for each of the patient's contacts. - Patient has been admitted to hospitalist service for treatment of his COVID-19 infection, pneumonia and sepsis.   Case was discussed with Dr. Zenia Resides during this visit.   Eldridge Marcott [facility] was evaluated in Emergency Department on 04/21/2019 for the symptoms described in the history of present illness. He was evaluated in the context of the global COVID-19 pandemic, which necessitated consideration that the patient might be at risk for infection with the SARS-CoV-2 virus that causes COVID-19. Institutional protocols and algorithms that pertain to the evaluation of patients at risk for COVID-19 are in a state of rapid change based on information released by regulatory bodies including the CDC and federal and state organizations. These policies and algorithms were followed during the patient's care in the ED.  Note: Portions of this report may have been transcribed using voice recognition software. Every effort was made to ensure accuracy; however, inadvertent computerized transcription errors may still be present. Final Clinical Impressions(s) / ED Diagnoses   Final diagnoses:  COVID-19 virus detected  Community acquired pneumonia of right lower lobe of lung ([place])  Sepsis without acute organ dysfunction, due to unspecified organism [facility])    ED Discharge Orders    None       Gari Crown  04/24/2019 1533    Lacretia Leigh, MD 04/10/19 [phone]

## 2019-04-09 NOTE — ED Triage Notes (Signed)
Pt's wife stated to EMS that he was diagnosed with pneumonia last week which he is being treated for, he arrived to ED on RA, no resp distress noted, oral temp reported to be 100.1. Pt has baseline dementia, however, wife reports he has declined in the how much he speaks/walks & interacts lately.

## 2019-04-10 ENCOUNTER — Other Ambulatory Visit: Payer: Self-pay

## 2019-04-10 DIAGNOSIS — A419 Sepsis, unspecified organism: Secondary | ICD-10-CM

## 2019-04-10 LAB — CBC WITH DIFFERENTIAL/PLATELET
Abs Immature Granulocytes: 0.04 10*3/uL (ref 0.00–0.07)
Basophils Absolute: 0 10*3/uL (ref 0.0–0.1)
Basophils Relative: 0 %
Eosinophils Absolute: 0 10*3/uL (ref 0.0–0.5)
Eosinophils Relative: 0 %
HCT: 34.5 % — ABNORMAL LOW (ref 39.0–52.0)
Hemoglobin: 10.6 g/dL — ABNORMAL LOW (ref 13.0–17.0)
Immature Granulocytes: 1 %
Lymphocytes Relative: 6 %
Lymphs Abs: 0.4 10*3/uL — ABNORMAL LOW (ref 0.7–4.0)
MCH: 29.1 pg (ref 26.0–34.0)
MCHC: 30.7 g/dL (ref 30.0–36.0)
MCV: 94.8 fL (ref 80.0–100.0)
Monocytes Absolute: 0.2 10*3/uL (ref 0.1–1.0)
Monocytes Relative: 3 %
Neutro Abs: 5.6 10*3/uL (ref 1.7–7.7)
Neutrophils Relative %: 90 %
Platelets: 159 10*3/uL (ref 150–400)
RBC: 3.64 MIL/uL — ABNORMAL LOW (ref 4.22–5.81)
RDW: 13.9 % (ref 11.5–15.5)
WBC: 6.2 10*3/uL (ref 4.0–10.5)
nRBC: 0 % (ref 0.0–0.2)

## 2019-04-10 LAB — COMPREHENSIVE METABOLIC PANEL
ALT: 33 U/L (ref 0–44)
AST: 29 U/L (ref 15–41)
Albumin: 2.7 g/dL — ABNORMAL LOW (ref 3.5–5.0)
Alkaline Phosphatase: 90 U/L (ref 38–126)
Anion gap: 13 (ref 5–15)
BUN: 24 mg/dL — ABNORMAL HIGH (ref 8–23)
CO2: 19 mmol/L — ABNORMAL LOW (ref 22–32)
Calcium: 7.8 mg/dL — ABNORMAL LOW (ref 8.9–10.3)
Chloride: 109 mmol/L (ref 98–111)
Creatinine, Ser: 0.74 mg/dL (ref 0.61–1.24)
GFR calc Af Amer: >60 mL/min (ref >60)
GFR calc non Af Amer: >60 mL/min (ref >60)
Glucose, Bld: 164 mg/dL — ABNORMAL HIGH (ref 70–99)
Potassium: 4.1 mmol/L (ref 3.5–5.1)
Sodium: 141 mmol/L (ref 135–145)
Total Bilirubin: 0.3 mg/dL (ref 0.3–1.2)
Total Protein: 6 g/dL — ABNORMAL LOW (ref 6.5–8.1)

## 2019-04-10 LAB — URINE CULTURE: Culture: NO GROWTH

## 2019-04-10 LAB — FERRITIN: Ferritin: 340 ng/mL — ABNORMAL HIGH (ref 24–336)

## 2019-04-10 LAB — PHOSPHORUS: Phosphorus: 2.2 mg/dL — ABNORMAL LOW (ref 2.5–4.6)

## 2019-04-10 LAB — PROCALCITONIN: Procalcitonin: 1.01 ng/mL

## 2019-04-10 LAB — MAGNESIUM: Magnesium: 1.8 mg/dL (ref 1.7–2.4)

## 2019-04-10 LAB — C-REACTIVE PROTEIN: CRP: 19.6 mg/dL — ABNORMAL HIGH (ref <1.0)

## 2019-04-10 LAB — D-DIMER, QUANTITATIVE: D-Dimer, Quant: 1.03 ug/mL-FEU — ABNORMAL HIGH (ref 0.00–0.50)

## 2019-04-10 LAB — ABO/RH: ABO/RH(D): O POS

## 2019-04-10 MED ORDER — METHYLPREDNISOLONE SODIUM SUCC 40 MG IJ SOLR
30.0000 mg | Freq: Two times a day (BID) | INTRAMUSCULAR | Status: DC
  Administered 2019-04-10 – 2019-04-17 (×14): 30 mg via INTRAVENOUS
  Filled 2019-04-10 (×14): qty 1

## 2019-04-10 MED ORDER — ENSURE ENLIVE PO LIQD
237.0000 mL | Freq: Three times a day (TID) | ORAL | Status: DC
  Administered 2019-04-10 – 2019-04-11 (×4): 237 mL via ORAL

## 2019-04-10 NOTE — Plan of Care (Signed)
  Problem: Respiratory: Goal: Will maintain a patent airway Outcome: Progressing   Problem: Clinical Measurements: Goal: Ability to maintain clinical measurements within normal limits will improve Outcome: Progressing   Problem: Clinical Measurements: Goal: Diagnostic test results will improve Outcome: Progressing   Problem: Nutrition: Goal: Adequate nutrition will be maintained Outcome: Progressing   Problem: Education: Goal: Knowledge of risk factors and measures for prevention of condition will improve Outcome: Not Progressing

## 2019-04-10 NOTE — Progress Notes (Signed)
Patient pulled condom cath off. Peri care performed and new condom cath applied with barrier also prior. Safety mittens placed to protect IV and catheter.

## 2019-04-10 NOTE — Progress Notes (Signed)
PROGRESS NOTE  Donyale Berthold [facility] UDJ:[id number] DOB: 1946-02-29 DOA: 04/11/2019 PCP: Josetta Huddle, MD   LOS: 1 day   Brief Narrative / Interim history: 73 year old male with history of advanced dementia who came into the hospital and was admitted on 04/01/2019 after about 4 days of decreasing his usual functionality as well as apparent worsening confusion.  He also has been having poor p.o. intake and weakness, at baseline he can answer 1-2 word sentences however has not spoken for the past 3 days.  He was diagnosed with COVID-19, chest x-ray on admission was concerning for pneumonia and was admitted to the hospital.  Subjective: Patient wakes up when called, looks at me, but does not talk.  Does not follow commands.  Assessment & Plan: Active Problems:   Pneumonia due to COVID-19 virus   CAP (community acquired pneumonia)   AKI (acute kidney injury) ([place])   Dementia ([place])   Principal Problem Acute Hypoxic Respiratory Failure due to Covid-19 Viral Illness /concern for CAP/lobar pneumonia -Patient's procalcitonin was slightly elevated and he was started on ceftriaxone and azithromycin, will continue that for 5 days -He was also started on steroids, continue -Inflammatory markers increasing today however he appears comfortable and remains on room air   COVID-19 Labs  Recent Labs    04/24/2019 1418 04/28/2019 1429 04/10/19 0500 04/10/19 0510  DDIMER  --  0.99* 1.03*  --   FERRITIN 313  --   --  340*  CRP 13.7*  --   --  19.6*    Lab Results  Component Value Date   SARSCOV2NAA POSITIVE (A) 04/24/2019    Fever:  Low-grade temp 99.5 Oxygen requirements:  None, on room air Antibiotics:  Ceftriaxone/azithromycin 7/9, day 2 Remdesivir:  7/9, day 2 Steroids:  7/9, day 2 Diuretics:  None Actemra:  No Convalescent Plasma:  No Vitamin C and Zinc:  Yes  Active Problems Hypertension -Received fluids, improved.  Hold further IV fluids and maintain euvolemia/equal balance  Acute  kidney injury -Creatinine has normalized after fluids.  Advanced dementia -Continue home Aricept  Scheduled Meds: . donepezil  10 mg Oral QHS  . enoxaparin (LOVENOX) injection  40 mg Subcutaneous Q24H  . methylPREDNISolone (SOLU-MEDROL) injection  30 mg Intravenous Q12H  . vitamin C  500 mg Oral Daily  . zinc sulfate  220 mg Oral Daily   Continuous Infusions: . azithromycin Stopped (04/12/2019 2202)  . cefTRIAXone (ROCEPHIN)  IV Stopped (04/10/2019 2100)  . remdesivir 100 mg in NS 250 mL     PRN Meds:.  DVT prophylaxis: Lovenox Code Status: DNR Family Communication: Attempted to call wife Tito Ausmus on her cell, no answer Disposition Plan: TBD  Consultants:   None   Procedures:   None     Objective: Vitals:   04/10/19 0224 04/10/19 0400 04/10/19 0448 04/10/19 0800  BP: (!) 124/58 (!) 139/53    Pulse: (!) 56 (!) 57    Resp: (!) 23 20    Temp: 99.1 F (37.3 C)  99.5 F (37.5 C) 99.1 F (37.3 C)  TempSrc: Oral  Oral Axillary  SpO2: 97% 99%    Weight:      Height:        Intake/Output Summary (Last 24 hours) at 04/10/2019 1036 Last data filed at 04/10/2019 0800 Gross per 24 hour  Intake 2100 ml  Output 625 ml  Net 1475 ml   Filed Weights   05/01/2019 2030 04/04/2019 2033  Weight: 64.4 kg 64.4 kg    Examination:  Constitutional: NAD Eyes: PERRL, lids and conjunctivae normal, no scleral icterus ENMT: Mucous membranes are dry Neck: normal, supple Respiratory: Shallow breathing, diminished breath sounds at the bases, no wheezing, no crackles Cardiovascular: Regular rate and rhythm, no murmurs / rubs / gallops. No LE edema.  Abdomen: no tenderness. Bowel sounds positive.  Musculoskeletal: no clubbing / cyanosis. Skin: no rashes Neurologic: Moves all 4 independently but does not follow commands   Data Reviewed: I have independently reviewed following labs and imaging studies  CBC: Recent Labs  Lab 04/19/2019 1123 04/10/19 0500  WBC 4.9 6.2  NEUTROABS  4.5 5.6  HGB 10.6* 10.6*  HCT 33.2* 34.5*  MCV 94.3 94.8  PLT 177 379   Basic Metabolic Panel: Recent Labs  Lab 04/17/2019 1123 04/10/19 0500  NA 141 141  K 3.8 4.1  CL 109 109  CO2 21* 19*  GLUCOSE 129* 164*  BUN 25* 24*  CREATININE 1.26* 0.74  CALCIUM 8.2* 7.8*  MG  --  1.8  PHOS  --  2.2*   GFR: Estimated Creatinine Clearance: 73.8 mL/min (by C-G formula based on SCr of 0.74 mg/dL). Liver Function Tests: Recent Labs  Lab 04/22/2019 1123 04/10/19 0500  AST 37 29  ALT 35 33  ALKPHOS 106 90  BILITOT 0.4 0.3  PROT 5.9* 6.0*  ALBUMIN 2.7* 2.7*   No results for input(s): LIPASE, AMYLASE in the last 168 hours. No results for input(s): AMMONIA in the last 168 hours. Coagulation Profile: Recent Labs  Lab 04/02/2019 1123  INR 1.1   Cardiac Enzymes: No results for input(s): CKTOTAL, CKMB, CKMBINDEX, TROPONINI in the last 168 hours. BNP (last 3 results) No results for input(s): PROBNP in the last 8760 hours. HbA1C: No results for input(s): HGBA1C in the last 72 hours. CBG: Recent Labs  Lab 04/04/2019 1109  GLUCAP 133*   Lipid Profile: No results for input(s): CHOL, HDL, LDLCALC, TRIG, CHOLHDL, LDLDIRECT in the last 72 hours. Thyroid Function Tests: No results for input(s): TSH, T4TOTAL, FREET4, T3FREE, THYROIDAB in the last 72 hours. Anemia Panel: Recent Labs    04/19/2019 1418 04/10/19 0510  FERRITIN 313 340*   Urine analysis:    Component Value Date/Time   COLORURINE YELLOW 04/29/2019 1123   APPEARANCEUR HAZY (A) 04/08/2019 1123   LABSPEC 1.026 04/13/2019 1123   PHURINE 5.0 04/21/2019 1123   GLUCOSEU NEGATIVE 04/28/2019 1123   HGBUR NEGATIVE 04/26/2019 1123   BILIRUBINUR NEGATIVE 04/14/2019 1123   KETONESUR NEGATIVE 04/26/2019 1123   PROTEINUR 100 (A) 04/03/2019 1123   NITRITE NEGATIVE 04/14/2019 1123   LEUKOCYTESUR NEGATIVE 04/12/2019 1123   Sepsis Labs: Invalid input(s): PROCALCITONIN, LACTICIDVEN  Recent Results (from the past 240 hour(s))  SARS  Coronavirus 2 (CEPHEID - Performed in [place] hospital lab), Hosp Order     Status: Abnormal   Collection Time: 04/26/2019 12:01 PM   Specimen: Nasopharyngeal Swab  Result Value Ref Range Status   SARS Coronavirus 2 POSITIVE (A) NEGATIVE Final    Comment: RESULT CALLED TO, READ BACK BY AND VERIFIED WITH: Claretta Fraise RN 13:45 04/12/2019 ([email or web address]) (NOTE) If result is NEGATIVE SARS-CoV-2 target nucleic acids are NOT DETECTED. The SARS-CoV-2 RNA is generally detectable in upper and lower  respiratory specimens during the acute phase of infection. The lowest  concentration of SARS-CoV-2 viral copies this assay can detect is 250  copies / mL. A negative result does not preclude SARS-CoV-2 infection  and should not be used as the sole basis for treatment or other  patient  management decisions.  A negative result may occur with  improper specimen collection / handling, submission of specimen other  than nasopharyngeal swab, presence of viral mutation(s) within the  areas targeted by this assay, and inadequate number of viral copies  (<250 copies / mL). A negative result must be combined with clinical  observations, patient history, and epidemiological information. If result is POSITIVE SARS-CoV-2 target nucleic acids are DETECTED.  The SARS-CoV-2 RNA is generally detectable in upper and lower  respiratory specimens during the acute phase of infection.  Positive  results are indicative of active infection with SARS-CoV-2.  Clinical  correlation with patient history and other diagnostic information is  necessary to determine patient infection status.  Positive results do  not rule out bacterial infection or co-infection with other viruses. If result is PRESUMPTIVE POSTIVE SARS-CoV-2 nucleic acids MAY BE PRESENT.   A presumptive positive result was obtained on the submitted specimen  and confirmed on repeat testing.  While 2019 novel coronavirus  (SARS-CoV-2) nucleic acids may be present in the  submitted sample  additional confirmatory testing may be necessary for epidemiological  and / or clinical management purposes  to differentiate between  SARS-CoV-2 and other Sarbecovirus currently known to infect humans.  If clinically indicated additional testing with an alternate test  methodology [phone]) i s advised. The SARS-CoV-2 RNA is generally  detectable in upper and lower respiratory specimens during the acute  phase of infection. The expected result is Negative. Fact Sheet for Patients:  StrictlyIdeas.no Fact Sheet for Healthcare Providers: BankingDealers.co.za This test is not yet approved or cleared by the [place] FDA and has been authorized for detection and/or diagnosis of SARS-CoV-2 by FDA under an Emergency Use Authorization (EUA).  This EUA will remain in effect (meaning this test can be used) for the duration of the COVID-19 declaration under Section 564(b)(1) of the Act, 21 U.S.C. section 360bbb-3(b)(1), unless the authorization is terminated or revoked sooner. Performed at [facility], [place] [address] [place], [place] [address]       Radiology Studies: Ct Head Wo Contrast  Result Date: 04/19/2019 CLINICAL DATA:  73 year old male with seizure. Altered level of consciousness. EXAM: CT HEAD WITHOUT CONTRAST TECHNIQUE: Contiguous axial images were obtained from the base of the skull through the vertex without intravenous contrast. COMPARISON:  03/12/2019 FINDINGS: Brain: No evidence of acute infarction, hemorrhage, hydrocephalus, extra-axial collection or mass lesion/mass effect. Mild atrophy and mild chronic small-vessel white matter ischemic changes again noted. Vascular: Carotid atherosclerotic calcifications noted Skull: Normal. Negative for fracture or focal lesion. Sinuses/Orbits: Small amount of fluid/mucosal thickening within the LEFT sphenoid sinus noted. Other: None IMPRESSION: 1. No evidence of acute  intracranial abnormality. 2. Mild atrophy and chronic small-vessel white matter ischemic changes. 3. Small amount of fluid/mucosal thickening within the LEFT sphenoid sinus which may represent acute sinusitis. Electronically Signed   By: Margarette Canada M.D.   On: 04/17/2019 12:58   Dg Chest Portable 1 View  Result Date: 04/25/2019 CLINICAL DATA:  Cough, fever and altered mental status. EXAM: PORTABLE CHEST 1 VIEW COMPARISON:  03/12/2019 FINDINGS: The cardiac silhouette, mediastinal and hilar contours are within normal limits and stable. Vague airspace opacity noted in the right lower lobe in the posterior costophrenic sulcus, suspicious for infiltrate. The left lung is clear. The bony thorax is intact. IMPRESSION: Suspect right basilar infiltrate. Electronically Signed   By: Marijo Sanes M.D.   On: 04/20/2019 11:43    Marzetta Board, MD, PhD [facility]  Contact via  [email or web address]  [place] P: [phone] F: [phone]

## 2019-04-10 NOTE — Plan of Care (Signed)
Care plan initated

## 2019-04-10 NOTE — Progress Notes (Signed)
 Initial Nutrition Assessment   RD working remotely.   DOCUMENTATION CODES:   Not applicable  INTERVENTION:   Downgrade diet to Dysphagia III (EASY to CHEW)  Feeding assistance at all meals  Ensure Enlive po TID, each supplement provides 350 kcal and 20 grams of protein  Pt receiving Hormel Shake daily with Breakfast which provides 520 kcals and 22 g of protein and Magic cup BID with lunch and dinner, each supplement provides 290 kcal and 9 grams of protein, automatically on meal trays to optimize nutritional intake.    NUTRITION DIAGNOSIS:   Inadequate oral intake related to acute illness, chronic illness, poor appetite as evidenced by per patient/family report.  GOAL:   Patient will meet greater than or equal to 90% of their needs  MONITOR:   PO intake, Supplement acceptance, Labs, Weight trends, Skin  REASON FOR ASSESSMENT:   Malnutrition Screening Tool    ASSESSMENT:   73 yo male admitted with COVID-19 infection with right basilar infiltrate with CAP, AKI, possible acute sinusitis.PMH includes advanced dementia; at baseline, pt needs assistance feeding himself, uses bedside commode, utilizes walker for ambulation, intermittently recognizes wife/sometimes calls out for parents at nighttime. Additional PMH includes HTN   Per chart review, wife reporting pt has not had a good meal in 2-3 days, wife has been trying to force fluids.  Pt spent last 3 days lying in bed without getting up which is unusual for him.   No recorded po intake   Unable to obtain further diet and weight history from pt. Current wt 64.4 kg. No recent weight encounters. Pt weighed 67.6 kg 2 years ago  Labs: Creatinine wdl, phosphorus 2.2 (L) Meds: solumedrol, Vit C, zinc sulfate  NUTRITION - FOCUSED PHYSICAL EXAM:  Unable to assess  Diet Order:   Diet Order            DIET SOFT Room service appropriate? No; Fluid consistency: Thin  Diet effective now              EDUCATION NEEDS:    No education needs have been identified at this time  Skin:  Skin Assessment: Reviewed RN Assessment(no pressure injuries noted per RN assessment)  Last BM:  no documented BM  Height:   Ht Readings from Last 1 Encounters:  04/07/2019 5\' 9"  (1.753 m)    Weight:   Wt Readings from Last 1 Encounters:  04/03/2019 64.4 kg    BMI:  Body mass index is 20.97 kg/m.  Estimated Nutritional Needs:   Kcal:  5681-2751 kcals  Protein:  88-98 g  Fluid:  >/= 1.8 L   Celica Kotowski MS, RDN, LDN, CNSC [phone] Pager  [phone] Weekend/On-Call Pager

## 2019-04-10 NOTE — Progress Notes (Signed)
Returned call to [place], patient's wife and let her know that patient is doing well, remains on room air with oxygen saturation at 99%.  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 Wife seemed satisfied with update.

## 2019-04-11 LAB — COMPREHENSIVE METABOLIC PANEL
ALT: 32 U/L (ref 0–44)
AST: 27 U/L (ref 15–41)
Albumin: 2.6 g/dL — ABNORMAL LOW (ref 3.5–5.0)
Alkaline Phosphatase: 93 U/L (ref 38–126)
Anion gap: 9 (ref 5–15)
BUN: 37 mg/dL — ABNORMAL HIGH (ref 8–23)
CO2: 22 mmol/L (ref 22–32)
Calcium: 8 mg/dL — ABNORMAL LOW (ref 8.9–10.3)
Chloride: 114 mmol/L — ABNORMAL HIGH (ref 98–111)
Creatinine, Ser: 0.81 mg/dL (ref 0.61–1.24)
GFR calc Af Amer: >60 mL/min (ref >60)
GFR calc non Af Amer: >60 mL/min (ref >60)
Glucose, Bld: 175 mg/dL — ABNORMAL HIGH (ref 70–99)
Potassium: 3.9 mmol/L (ref 3.5–5.1)
Sodium: 145 mmol/L (ref 135–145)
Total Bilirubin: 0.3 mg/dL (ref 0.3–1.2)
Total Protein: 5.7 g/dL — ABNORMAL LOW (ref 6.5–8.1)

## 2019-04-11 LAB — CBC WITH DIFFERENTIAL/PLATELET
Abs Immature Granulocytes: 0.07 10*3/uL (ref 0.00–0.07)
Basophils Absolute: 0 10*3/uL (ref 0.0–0.1)
Basophils Relative: 0 %
Eosinophils Absolute: 0 10*3/uL (ref 0.0–0.5)
Eosinophils Relative: 0 %
HCT: 31.3 % — ABNORMAL LOW (ref 39.0–52.0)
Hemoglobin: 10.1 g/dL — ABNORMAL LOW (ref 13.0–17.0)
Immature Granulocytes: 1 %
Lymphocytes Relative: 7 %
Lymphs Abs: 0.5 10*3/uL — ABNORMAL LOW (ref 0.7–4.0)
MCH: 30 pg (ref 26.0–34.0)
MCHC: 32.3 g/dL (ref 30.0–36.0)
MCV: 92.9 fL (ref 80.0–100.0)
Monocytes Absolute: 0.5 10*3/uL (ref 0.1–1.0)
Monocytes Relative: 6 %
Neutro Abs: 6.6 10*3/uL (ref 1.7–7.7)
Neutrophils Relative %: 86 %
Platelets: 165 10*3/uL (ref 150–400)
RBC: 3.37 MIL/uL — ABNORMAL LOW (ref 4.22–5.81)
RDW: 14 % (ref 11.5–15.5)
WBC: 7.7 10*3/uL (ref 4.0–10.5)
nRBC: 0 % (ref 0.0–0.2)

## 2019-04-11 LAB — FERRITIN: Ferritin: 318 ng/mL (ref 24–336)

## 2019-04-11 LAB — PROCALCITONIN: Procalcitonin: 0.83 ng/mL

## 2019-04-11 LAB — C-REACTIVE PROTEIN: CRP: 9.2 mg/dL — ABNORMAL HIGH (ref <1.0)

## 2019-04-11 LAB — MAGNESIUM: Magnesium: 2 mg/dL (ref 1.7–2.4)

## 2019-04-11 LAB — D-DIMER, QUANTITATIVE: D-Dimer, Quant: 0.7 ug/mL-FEU — ABNORMAL HIGH (ref 0.00–0.50)

## 2019-04-11 LAB — PHOSPHORUS: Phosphorus: 1.9 mg/dL — ABNORMAL LOW (ref 2.5–4.6)

## 2019-04-11 MED ORDER — ORAL CARE MOUTH RINSE
15.0000 mL | Freq: Two times a day (BID) | OROMUCOSAL | Status: DC
  Administered 2019-04-11 – 2019-04-17 (×13): 15 mL via OROMUCOSAL

## 2019-04-11 MED ORDER — ACETAMINOPHEN 650 MG RE SUPP
650.0000 mg | RECTAL | Status: DC | PRN
  Administered 2019-04-11 – 2019-04-27 (×5): 650 mg via RECTAL
  Filled 2019-04-11 (×5): qty 1

## 2019-04-11 MED ORDER — MORPHINE SULFATE (PF) 2 MG/ML IV SOLN
2.0000 mg | INTRAVENOUS | Status: DC | PRN
  Administered 2019-04-12 (×2): 2 mg via INTRAVENOUS
  Filled 2019-04-11 (×2): qty 1

## 2019-04-11 NOTE — Progress Notes (Signed)
PROGRESS NOTE  Gerber Penza [facility] JFH:[id number] DOB: 31-Jul-1946 DOA: 04/02/2019 PCP: Josetta Huddle, MD   LOS: 2 days   Brief Narrative / Interim history: 73 year old male with history of advanced dementia who came into the hospital and was admitted on 04/29/2019 after about 4 days of decreasing his usual functionality as well as apparent worsening confusion.  He also has been having poor p.o. intake and weakness, at baseline he can answer 1-2 word sentences however has not spoken for the past 3 days.  He was diagnosed with COVID-19, chest x-ray on admission was concerning for pneumonia and was admitted to the hospital.  Subjective: More awake today, mumbles and tries to answer some questions which is an improvement as yesterday would not talk.  He tracks me with his eyes and answers "I am fine" to most of my questions  Assessment & Plan: Active Problems:   Pneumonia due to COVID-19 virus   CAP (community acquired pneumonia)   AKI (acute kidney injury) ([place])   Dementia ([place])   Principal Problem Acute Hypoxic Respiratory Failure due to Covid-19 Viral Illness /concern for CAP/lobar pneumonia -Patient's procalcitonin was slightly elevated and he was started on ceftriaxone and azithromycin, will continue that for 5 days -He was also started on steroids, continue -inflammatory markers seem to be improving and currently on room air -Clinically improving, will get PT consult   COVID-19 Labs  Recent Labs    04/07/2019 1418 04/29/2019 1429 04/10/19 0500 04/10/19 0510 04/11/19 0152  DDIMER  --  0.99* 1.03*  --  0.70*  FERRITIN 313  --   --  340* 318  CRP 13.7*  --   --  19.6* 9.2*    Lab Results  Component Value Date   SARSCOV2NAA POSITIVE (A) 04/10/2019    Fever:  Afebrile Oxygen requirements:  None, on room air Antibiotics:  Ceftriaxone/azithromycin 7/9, day 3 Remdesivir:  7/9, day 3 Steroids:  7/9, day 3 Diuretics:  None Actemra:  No Convalescent Plasma:  No Vitamin C and Zinc:   Yes  Active Problems Hypertension -Blood pressure stable today  Acute kidney injury -Creatinine has normalized with fluids, now hold to avoid overload  Advanced dementia -Continue home Aricept  Scheduled Meds: . donepezil  10 mg Oral QHS  . enoxaparin (LOVENOX) injection  40 mg Subcutaneous Q24H  . feeding supplement (ENSURE ENLIVE)  237 mL Oral TID BM  . mouth rinse  15 mL Mouth Rinse BID  . methylPREDNISolone (SOLU-MEDROL) injection  30 mg Intravenous Q12H  . vitamin C  500 mg Oral Daily  . zinc sulfate  220 mg Oral Daily   Continuous Infusions: . azithromycin Stopped (04/10/19 2151)  . cefTRIAXone (ROCEPHIN)  IV Stopped (04/10/19 2043)  . remdesivir 100 mg in NS 250 mL Stopped (04/10/19 2306)   PRN Meds:.  DVT prophylaxis: Lovenox Code Status: DNR Family Communication: Attempted again to call wife today Disposition Plan: TBD  Consultants:   None   Procedures:   None     Objective: Vitals:   04/10/19 2021 04/11/19 0000 04/11/19 0400 04/11/19 0730  BP:  128/72 133/72 135/78  Pulse:  70 64 64  Resp:  19 18 15   Temp: (!) 100.5 F (38.1 C) 100.1 F (37.8 C) 99 F (37.2 C) 98.7 F (37.1 C)  TempSrc: Axillary Axillary Axillary Oral  SpO2:  99% 100% 100%  Weight:      Height:        Intake/Output Summary (Last 24 hours) at 04/11/2019 1046 Last data  filed at 04/11/2019 1000 Gross per 24 hour  Intake 1151.67 ml  Output 450 ml  Net 701.67 ml   Filed Weights   04/16/2019 2030 04/03/2019 2033  Weight: 64.4 kg 64.4 kg    Examination:  Constitutional: No distress Eyes: No scleral icterus ENMT: Moist membranes Neck: normal, supple Respiratory: Diminished breath sounds at the bases but no wheezing or crackles heard.  Overall distant breath sounds on anterior auscultation Cardiovascular: Regular rate and rhythm, no murmurs.  No peripheral edema Abdomen: Soft, NT, ND, bowel sounds positive Musculoskeletal: no clubbing / cyanosis. Skin: No rashes seen  Neurologic: Appears nonfocal but does not follow commands   Data Reviewed: I have independently reviewed following labs and imaging studies  CBC: Recent Labs  Lab 04/03/2019 1123 04/10/19 0500 04/11/19 0152  WBC 4.9 6.2 7.7  NEUTROABS 4.5 5.6 6.6  HGB 10.6* 10.6* 10.1*  HCT 33.2* 34.5* 31.3*  MCV 94.3 94.8 92.9  PLT 177 159 854   Basic Metabolic Panel: Recent Labs  Lab 04/23/2019 1123 04/10/19 0500 04/11/19 0152  NA 141 141 145  K 3.8 4.1 3.9  CL 109 109 114*  CO2 21* 19* 22  GLUCOSE 129* 164* 175*  BUN 25* 24* 37*  CREATININE 1.26* 0.74 0.81  CALCIUM 8.2* 7.8* 8.0*  MG  --  1.8 2.0  PHOS  --  2.2* 1.9*   GFR: Estimated Creatinine Clearance: 72.9 mL/min (by C-G formula based on SCr of 0.81 mg/dL). Liver Function Tests: Recent Labs  Lab 04/16/2019 1123 04/10/19 0500 04/11/19 0152  AST 37 29 27  ALT 35 33 32  ALKPHOS 106 90 93  BILITOT 0.4 0.3 0.3  PROT 5.9* 6.0* 5.7*  ALBUMIN 2.7* 2.7* 2.6*   No results for input(s): LIPASE, AMYLASE in the last 168 hours. No results for input(s): AMMONIA in the last 168 hours. Coagulation Profile: Recent Labs  Lab 04/22/2019 1123  INR 1.1   Cardiac Enzymes: No results for input(s): CKTOTAL, CKMB, CKMBINDEX, TROPONINI in the last 168 hours. BNP (last 3 results) No results for input(s): PROBNP in the last 8760 hours. HbA1C: No results for input(s): HGBA1C in the last 72 hours. CBG: Recent Labs  Lab 04/24/2019 1109  GLUCAP 133*   Lipid Profile: No results for input(s): CHOL, HDL, LDLCALC, TRIG, CHOLHDL, LDLDIRECT in the last 72 hours. Thyroid Function Tests: No results for input(s): TSH, T4TOTAL, FREET4, T3FREE, THYROIDAB in the last 72 hours. Anemia Panel: Recent Labs    04/10/19 0510 04/11/19 0152  FERRITIN 340* 318   Urine analysis:    Component Value Date/Time   COLORURINE YELLOW 04/11/2019 1123   APPEARANCEUR HAZY (A) 04/13/2019 1123   LABSPEC 1.026 04/03/2019 1123   PHURINE 5.0 04/23/2019 1123    GLUCOSEU NEGATIVE 04/14/2019 1123   HGBUR NEGATIVE 04/28/2019 1123   BILIRUBINUR NEGATIVE 04/13/2019 1123   KETONESUR NEGATIVE 04/01/2019 1123   PROTEINUR 100 (A) 04/29/2019 1123   NITRITE NEGATIVE 04/08/2019 1123   LEUKOCYTESUR NEGATIVE 04/03/2019 1123   Sepsis Labs: Invalid input(s): PROCALCITONIN, LACTICIDVEN  Recent Results (from the past 240 hour(s))  Blood Culture (routine x 2)     Status: None (Preliminary result)   Collection Time: 04/22/2019 11:23 AM   Specimen: BLOOD RIGHT FOREARM  Result Value Ref Range Status   Specimen Description BLOOD RIGHT FOREARM  Final   Special Requests   Final    BOTTLES DRAWN AEROBIC AND ANAEROBIC Blood Culture adequate volume   Culture   Final    NO GROWTH 1  DAY Performed at [facility], [place] [address], [place], [place] [address]    Report Status PENDING  Incomplete  Urine culture     Status: None   Collection Time: 04/28/2019 11:23 AM   Specimen: In/Out Cath Urine  Result Value Ref Range Status   Specimen Description IN/OUT CATH URINE  Final   Special Requests NONE  Final   Culture   Final    NO GROWTH Performed at [facility], [place] [address], [place], [place] [address]    Report Status 04/10/2019 FINAL  Final  Blood Culture (routine x 2)     Status: None (Preliminary result)   Collection Time: 04/06/2019 11:28 AM   Specimen: BLOOD  Result Value Ref Range Status   Specimen Description BLOOD LEFT ANTECUBITAL  Final   Special Requests   Final    BOTTLES DRAWN AEROBIC AND ANAEROBIC Blood Culture results may not be optimal due to an excessive volume of blood received in culture bottles   Culture   Final    NO GROWTH 1 DAY Performed at [facility], [place] [address], [place], [place] [address]    Report Status PENDING  Incomplete  SARS Coronavirus 2 (CEPHEID - Performed in [place] hospital lab), Hosp Order     Status: Abnormal   Collection Time: 04/25/2019 12:01 PM   Specimen: Nasopharyngeal Swab  Result Value Ref  Range Status   SARS Coronavirus 2 POSITIVE (A) NEGATIVE Final    Comment: RESULT CALLED TO, READ BACK BY AND VERIFIED WITH: Claretta Fraise RN 13:45 04/15/2019 ([email or web address]) (NOTE) If result is NEGATIVE SARS-CoV-2 target nucleic acids are NOT DETECTED. The SARS-CoV-2 RNA is generally detectable in upper and lower  respiratory specimens during the acute phase of infection. The lowest  concentration of SARS-CoV-2 viral copies this assay can detect is 250  copies / mL. A negative result does not preclude SARS-CoV-2 infection  and should not be used as the sole basis for treatment or other  patient management decisions.  A negative result may occur with  improper specimen collection / handling, submission of specimen other  than nasopharyngeal swab, presence of viral mutation(s) within the  areas targeted by this assay, and inadequate number of viral copies  (<250 copies / mL). A negative result must be combined with clinical  observations, patient history, and epidemiological information. If result is POSITIVE SARS-CoV-2 target nucleic acids are DETECTED.  The SARS-CoV-2 RNA is generally detectable in upper and lower  respiratory specimens during the acute phase of infection.  Positive  results are indicative of active infection with SARS-CoV-2.  Clinical  correlation with patient history and other diagnostic information is  necessary to determine patient infection status.  Positive results do  not rule out bacterial infection or co-infection with other viruses. If result is PRESUMPTIVE POSTIVE SARS-CoV-2 nucleic acids MAY BE PRESENT.   A presumptive positive result was obtained on the submitted specimen  and confirmed on repeat testing.  While 2019 novel coronavirus  (SARS-CoV-2) nucleic acids may be present in the submitted sample  additional confirmatory testing may be necessary for epidemiological  and / or clinical management purposes  to differentiate between  SARS-CoV-2 and other  Sarbecovirus currently known to infect humans.  If clinically indicated additional testing with an alternate test  methodology [phone]) i s advised. The SARS-CoV-2 RNA is generally  detectable in upper and lower respiratory specimens during the acute  phase of infection. The expected result is Negative. Fact  Sheet for Patients:  StrictlyIdeas.no Fact Sheet for Healthcare Providers: BankingDealers.co.za This test is not yet approved or cleared by the [place] FDA and has been authorized for detection and/or diagnosis of SARS-CoV-2 by FDA under an Emergency Use Authorization (EUA).  This EUA will remain in effect (meaning this test can be used) for the duration of the COVID-19 declaration under Section 564(b)(1) of the Act, 21 U.S.C. section 360bbb-3(b)(1), unless the authorization is terminated or revoked sooner. Performed at [facility], [place] [address], [place],  [address]       Radiology Studies: Ct Head Wo Contrast  Result Date: 04/25/2019 CLINICAL DATA:  73 year old male with seizure. Altered level of consciousness. EXAM: CT HEAD WITHOUT CONTRAST TECHNIQUE: Contiguous axial images were obtained from the base of the skull through the vertex without intravenous contrast. COMPARISON:  03/12/2019 FINDINGS: Brain: No evidence of acute infarction, hemorrhage, hydrocephalus, extra-axial collection or mass lesion/mass effect. Mild atrophy and mild chronic small-vessel white matter ischemic changes again noted. Vascular: Carotid atherosclerotic calcifications noted Skull: Normal. Negative for fracture or focal lesion. Sinuses/Orbits: Small amount of fluid/mucosal thickening within the LEFT sphenoid sinus noted. Other: None IMPRESSION: 1. No evidence of acute intracranial abnormality. 2. Mild atrophy and chronic small-vessel white matter ischemic changes. 3. Small amount of fluid/mucosal thickening within the LEFT sphenoid sinus  which may represent acute sinusitis. Electronically Signed   By: Margarette Canada M.D.   On: 04/29/2019 12:58   Dg Chest Portable 1 View  Result Date: 04/02/2019 CLINICAL DATA:  Cough, fever and altered mental status. EXAM: PORTABLE CHEST 1 VIEW COMPARISON:  03/12/2019 FINDINGS: The cardiac silhouette, mediastinal and hilar contours are within normal limits and stable. Vague airspace opacity noted in the right lower lobe in the posterior costophrenic sulcus, suspicious for infiltrate. The left lung is clear. The bony thorax is intact. IMPRESSION: Suspect right basilar infiltrate. Electronically Signed   By: Marijo Sanes M.D.   On: 04/16/2019 11:43    Marzetta Board, MD, PhD [facility]  Contact via  [email or web address]  [place] P: [phone] F: [phone]

## 2019-04-11 NOTE — Progress Notes (Signed)
Spoke with wife via [employer]. Discussed progress and plan of care. Helped patient Facetime with wife. Wife is pleased.

## 2019-04-11 NOTE — Progress Notes (Signed)
Note for OT  OT staff not present 6/11 and 6/12. OT to evaluate pt on 6/13.  [place] Pager [phone] Office [phone]

## 2019-04-11 NOTE — Evaluation (Signed)
Physical Therapy Evaluation Patient Details Name: Thomas Bridges MRN: [id number] DOB: September 27, 1946 Today's Date: 04/11/2019   History of Present Illness  Pt adm with fever, PNA, and Covid positive. PMH - advanced dementia, HTN  Clinical Impression  Pt admitted with above diagnosis and presents to PT with functional limitations due to deficits listed below (See PT problem list). Pt needs skilled PT to maximize independence and safety to allow discharge to SNF unless he progresses to the point that wife can assist him. Pt's severe dementia will make progress more difficult.     Follow Up Recommendations SNF(unless pt progresses to the point where wife can assist him)    Equipment Recommendations  Other (comment)(To be determined)    Recommendations for Other Services       Precautions / Restrictions Precautions Precautions: Fall Restrictions Weight Bearing Restrictions: No      Mobility  Bed Mobility Overal bed mobility: Needs Assistance Bed Mobility: Supine to Sit;Sit to Supine     Supine to sit: Total assist Sit to supine: Total assist   General bed mobility comments: Assist for all aspects  Transfers                 General transfer comment: unable to attempt with 1 person assist  Ambulation/Gait                Stairs            Wheelchair Mobility    Modified Rankin (Stroke Patients Only)       Balance Overall balance assessment: Needs assistance Sitting-balance support: Bilateral upper extremity supported;Feet supported Sitting balance-Leahy Scale: Poor Sitting balance - Comments: Sat EOB x 6 minutes with supervision to min assist Postural control: Posterior lean                                   Pertinent Vitals/Pain Pain Assessment: Faces Faces Pain Scale: No hurt    Home Living Family/patient expects to be discharged to:: Private residence Living Arrangements: Spouse/significant other Available Help at  Discharge: Family           Home Equipment: Gilford Rile - 2 wheels;Bedside commode      Prior Function Level of Independence: Needs assistance   Gait / Transfers Assistance Needed: Assist to amb. Uses BSC with assist.  ADL's / Homemaking Assistance Needed: Needs assist with feeding        Hand Dominance        Extremity/Trunk Assessment   Upper Extremity Assessment Upper Extremity Assessment: Defer to OT evaluation    Lower Extremity Assessment Lower Extremity Assessment: Difficult to assess due to impaired cognition(spontaneous movement noted)       Communication   Communication: Other (comment)(Pt muttering but unable to understand)  Cognition Arousal/Alertness: Awake/alert Behavior During Therapy: Restless Overall Cognitive Status: No family/caregiver present to determine baseline cognitive functioning                                 General Comments: At baseline pt with significant dementia. Prior to illness pt would answer in 1-2 words. Didn't always recognize wife      General Comments General comments (skin integrity, edema, etc.): VSS. Pt on RA    Exercises     Assessment/Plan    PT Assessment Patient needs continued PT services  PT Problem List Decreased strength;Decreased activity tolerance;Decreased  balance;Decreased mobility;Decreased cognition       PT Treatment Interventions DME instruction;Gait training;Functional mobility training;Therapeutic activities;Therapeutic exercise;Balance training;Patient/family education    PT Goals (Current goals can be found in the Care Plan section)  Acute Rehab PT Goals Patient Stated Goal: Pt unable to state PT Goal Formulation: Patient unable to participate in goal setting Time For Goal Achievement: 04/25/19 Potential to Achieve Goals: Fair    Frequency Min 3X/week   Barriers to discharge        Co-evaluation               AM-PAC PT "6 Clicks" Mobility  Outcome Measure Help  needed turning from your back to your side while in a flat bed without using bedrails?: Total Help needed moving from lying on your back to sitting on the side of a flat bed without using bedrails?: Total Help needed moving to and from a bed to a chair (including a wheelchair)?: Total Help needed standing up from a chair using your arms (e.g., wheelchair or bedside chair)?: Total Help needed to walk in hospital room?: Total Help needed climbing 3-5 steps with a railing? : Total 6 Click Score: 6    End of Session   Activity Tolerance: Patient tolerated treatment well Patient left: in bed;with call bell/phone within reach;with bed alarm set Nurse Communication: Mobility status PT Visit Diagnosis: Other abnormalities of gait and mobility (R26.89);Difficulty in walking, not elsewhere classified (R26.2)    Time: 6384-5364 PT Time Calculation (min) (ACUTE ONLY): 13 min   Charges:   PT Evaluation $PT Eval Moderate Complexity: [place] Pager [phone] Office [place] 04/11/2019, 11:36 AM

## 2019-04-12 LAB — CBC WITH DIFFERENTIAL/PLATELET
Abs Immature Granulocytes: 0.06 10*3/uL (ref 0.00–0.07)
Basophils Absolute: 0 10*3/uL (ref 0.0–0.1)
Basophils Relative: 0 %
Eosinophils Absolute: 0 10*3/uL (ref 0.0–0.5)
Eosinophils Relative: 0 %
HCT: 32.3 % — ABNORMAL LOW (ref 39.0–52.0)
Hemoglobin: 10.1 g/dL — ABNORMAL LOW (ref 13.0–17.0)
Immature Granulocytes: 1 %
Lymphocytes Relative: 8 %
Lymphs Abs: 0.5 10*3/uL — ABNORMAL LOW (ref 0.7–4.0)
MCH: 29.5 pg (ref 26.0–34.0)
MCHC: 31.3 g/dL (ref 30.0–36.0)
MCV: 94.4 fL (ref 80.0–100.0)
Monocytes Absolute: 0.5 10*3/uL (ref 0.1–1.0)
Monocytes Relative: 8 %
Neutro Abs: 4.8 10*3/uL (ref 1.7–7.7)
Neutrophils Relative %: 83 %
Platelets: 207 10*3/uL (ref 150–400)
RBC: 3.42 MIL/uL — ABNORMAL LOW (ref 4.22–5.81)
RDW: 14.1 % (ref 11.5–15.5)
WBC: 5.8 10*3/uL (ref 4.0–10.5)
nRBC: 0 % (ref 0.0–0.2)

## 2019-04-12 LAB — MAGNESIUM: Magnesium: 2.1 mg/dL (ref 1.7–2.4)

## 2019-04-12 LAB — COMPREHENSIVE METABOLIC PANEL
ALT: 85 U/L — ABNORMAL HIGH (ref 0–44)
AST: 66 U/L — ABNORMAL HIGH (ref 15–41)
Albumin: 2.7 g/dL — ABNORMAL LOW (ref 3.5–5.0)
Alkaline Phosphatase: 93 U/L (ref 38–126)
Anion gap: 9 (ref 5–15)
BUN: 37 mg/dL — ABNORMAL HIGH (ref 8–23)
CO2: 22 mmol/L (ref 22–32)
Calcium: 8 mg/dL — ABNORMAL LOW (ref 8.9–10.3)
Chloride: 117 mmol/L — ABNORMAL HIGH (ref 98–111)
Creatinine, Ser: 0.73 mg/dL (ref 0.61–1.24)
GFR calc Af Amer: >60 mL/min (ref >60)
GFR calc non Af Amer: >60 mL/min (ref >60)
Glucose, Bld: 151 mg/dL — ABNORMAL HIGH (ref 70–99)
Potassium: 4 mmol/L (ref 3.5–5.1)
Sodium: 148 mmol/L — ABNORMAL HIGH (ref 135–145)
Total Bilirubin: 0.3 mg/dL (ref 0.3–1.2)
Total Protein: 5.9 g/dL — ABNORMAL LOW (ref 6.5–8.1)

## 2019-04-12 LAB — FERRITIN: Ferritin: 383 ng/mL — ABNORMAL HIGH (ref 24–336)

## 2019-04-12 LAB — D-DIMER, QUANTITATIVE: D-Dimer, Quant: 0.66 ug/mL-FEU — ABNORMAL HIGH (ref 0.00–0.50)

## 2019-04-12 LAB — PHOSPHORUS: Phosphorus: 2.6 mg/dL (ref 2.5–4.6)

## 2019-04-12 LAB — C-REACTIVE PROTEIN: CRP: 4.4 mg/dL — ABNORMAL HIGH (ref <1.0)

## 2019-04-12 MED ORDER — DEXTROSE 5 % IV SOLN
INTRAVENOUS | Status: DC
  Administered 2019-04-12 – 2019-04-19 (×8): via INTRAVENOUS

## 2019-04-12 NOTE — Progress Notes (Signed)
PROGRESS NOTE  Thomas Bridges [facility] NID:[id number] DOB: 10-Jun-1946 DOA: 04/30/2019 PCP: Josetta Huddle, MD   LOS: 3 days   Brief Narrative / Interim history: 73 year old male with history of advanced dementia who came into the hospital and was admitted on 04/04/2019 after about 4 days of decreasing his usual functionality as well as apparent worsening confusion.  He also has been having poor p.o. intake and weakness, at baseline he can answer 1-2 word sentences however has not spoken for the past 3 days.  He was diagnosed with COVID-19, chest x-ray on admission was concerning for pneumonia and was admitted to the hospital.  Subjective: Sleeping when I entered the room but wakes up, mumbles but not answer any questions consistently.  Tracks me with his eyes.  Assessment & Plan: Active Problems:   Pneumonia due to COVID-19 virus   CAP (community acquired pneumonia)   AKI (acute kidney injury) ([place])   Dementia ([place])   Principal Problem Acute Hypoxic Respiratory Failure due to Covid-19 Viral Illness /concern for CAP/lobar pneumonia -Patient's procalcitonin was slightly elevated and he was started on ceftriaxone and azithromycin, will continue that for 5 days, today day 4/5 -He was also started on steroids, continue -Inflammatory markers continue to improve today -PT recommended SNF, discussed with wife over the phone and she is agreeable.  Consult Education officer, museum.   COVID-19 Labs  Recent Labs    04/10/19 0500 04/10/19 0510 04/11/19 0152 04/12/19 0335  DDIMER 1.03*  --  0.70* 0.66*  FERRITIN  --  340* 318 383*  CRP  --  19.6* 9.2* 4.4*    Lab Results  Component Value Date   SARSCOV2NAA POSITIVE (A) 05/01/2019    Active Problems Hypertension -Blood pressure stable  Acute kidney injury -Creatinine now is normal  Advanced dementia -Continue home Aricept  Scheduled Meds: . donepezil  10 mg Oral QHS  . enoxaparin (LOVENOX) injection  40 mg Subcutaneous Q24H  . feeding supplement  (ENSURE ENLIVE)  237 mL Oral TID BM  . mouth rinse  15 mL Mouth Rinse BID  . methylPREDNISolone (SOLU-MEDROL) injection  30 mg Intravenous Q12H  . vitamin C  500 mg Oral Daily  . zinc sulfate  220 mg Oral Daily   Continuous Infusions: . azithromycin 500 mg (04/11/19 2128)  . cefTRIAXone (ROCEPHIN)  IV 1 g (04/11/19 2043)  . remdesivir 100 mg in NS 250 mL Stopped (04/11/19 2311)   PRN Meds:.  DVT prophylaxis: Lovenox Code Status: DNR Family Communication: d/w wife [name] over the phone, [phone] Disposition Plan: TBD  Consultants:   None   Procedures:   None     Objective: Vitals:   04/12/19 0400 04/12/19 0600 04/12/19 0608 04/12/19 0800  BP: (!) 145/65   (!) 144/58  Pulse: (!) 54 (!) 59  60  Resp: 20 16  20   Temp:   98 F (36.7 C) 98.3 F (36.8 C)  TempSrc:   Axillary   SpO2: 96% 100%  97%  Weight:      Height:        Intake/Output Summary (Last 24 hours) at 04/12/2019 1037 Last data filed at 04/12/2019 0600 Gross per 24 hour  Intake 951.82 ml  Output 1150 ml  Net -198.18 ml   Filed Weights   04/22/2019 2030 04/11/2019 2033  Weight: 64.4 kg 64.4 kg    Examination:  Constitutional: NAD Eyes: no icterus  ENMT: mmm Neck: supple Respiratory: diminished at the bases, no wheezing, no crackles Cardiovascular: Regular rate and rhythm, no  murmurs.  No edema Abdomen: Nontender, bowel sounds positive Musculoskeletal: no clubbing / cyanosis. Skin: No new rashes Neurologic: Appears nonfocal but does not follow commands   Data Reviewed: I have independently reviewed following labs and imaging studies  CBC: Recent Labs  Lab 04/05/2019 1123 04/10/19 0500 04/11/19 0152 04/12/19 0335  WBC 4.9 6.2 7.7 5.8  NEUTROABS 4.5 5.6 6.6 4.8  HGB 10.6* 10.6* 10.1* 10.1*  HCT 33.2* 34.5* 31.3* 32.3*  MCV 94.3 94.8 92.9 94.4  PLT 177 159 165 474   Basic Metabolic Panel: Recent Labs  Lab 04/18/2019 1123 04/10/19 0500 04/11/19 0152 04/12/19 0335  NA 141 141 145  --    K 3.8 4.1 3.9  --   CL 109 109 114*  --   CO2 21* 19* 22  --   GLUCOSE 129* 164* 175*  --   BUN 25* 24* 37*  --   CREATININE 1.26* 0.74 0.81  --   CALCIUM 8.2* 7.8* 8.0*  --   MG  --  1.8 2.0 2.1  PHOS  --  2.2* 1.9*  --    GFR: Estimated Creatinine Clearance: 72.9 mL/min (by C-G formula based on SCr of 0.81 mg/dL). Liver Function Tests: Recent Labs  Lab 04/17/2019 1123 04/10/19 0500 04/11/19 0152  AST 37 29 27  ALT 35 33 32  ALKPHOS 106 90 93  BILITOT 0.4 0.3 0.3  PROT 5.9* 6.0* 5.7*  ALBUMIN 2.7* 2.7* 2.6*   No results for input(s): LIPASE, AMYLASE in the last 168 hours. No results for input(s): AMMONIA in the last 168 hours. Coagulation Profile: Recent Labs  Lab 04/01/2019 1123  INR 1.1   Cardiac Enzymes: No results for input(s): CKTOTAL, CKMB, CKMBINDEX, TROPONINI in the last 168 hours. BNP (last 3 results) No results for input(s): PROBNP in the last 8760 hours. HbA1C: No results for input(s): HGBA1C in the last 72 hours. CBG: Recent Labs  Lab 04/18/2019 1109  GLUCAP 133*   Lipid Profile: No results for input(s): CHOL, HDL, LDLCALC, TRIG, CHOLHDL, LDLDIRECT in the last 72 hours. Thyroid Function Tests: No results for input(s): TSH, T4TOTAL, FREET4, T3FREE, THYROIDAB in the last 72 hours. Anemia Panel: Recent Labs    04/11/19 0152 04/12/19 0335  FERRITIN 318 383*   Urine analysis:    Component Value Date/Time   COLORURINE YELLOW 05/01/2019 1123   APPEARANCEUR HAZY (A) 04/13/2019 1123   LABSPEC 1.026 04/18/2019 1123   PHURINE 5.0 04/28/2019 1123   GLUCOSEU NEGATIVE 04/12/2019 1123   HGBUR NEGATIVE 04/01/2019 1123   BILIRUBINUR NEGATIVE 04/04/2019 1123   KETONESUR NEGATIVE 04/10/2019 1123   PROTEINUR 100 (A) 04/26/2019 1123   NITRITE NEGATIVE 04/22/2019 1123   LEUKOCYTESUR NEGATIVE 04/28/2019 1123   Sepsis Labs: Invalid input(s): PROCALCITONIN, LACTICIDVEN  Recent Results (from the past 240 hour(s))  Blood Culture (routine x 2)     Status: None  (Preliminary result)   Collection Time: 04/23/2019 11:23 AM   Specimen: BLOOD RIGHT FOREARM  Result Value Ref Range Status   Specimen Description BLOOD RIGHT FOREARM  Final   Special Requests   Final    BOTTLES DRAWN AEROBIC AND ANAEROBIC Blood Culture adequate volume   Culture   Final    NO GROWTH 2 DAYS Performed at [facility], [address] [place], [place] [address]    Report Status PENDING  Incomplete  Urine culture     Status: None   Collection Time: 04/23/2019 11:23 AM   Specimen: In/Out Cath Urine  Result  Value Ref Range Status   Specimen Description IN/OUT CATH URINE  Final   Special Requests NONE  Final   Culture   Final    NO GROWTH Performed at [facility], [address], [place], [place] [address]    Report Status 04/10/2019 FINAL  Final  Blood Culture (routine x 2)     Status: None (Preliminary result)   Collection Time: 04/29/2019 11:28 AM   Specimen: BLOOD  Result Value Ref Range Status   Specimen Description BLOOD LEFT ANTECUBITAL  Final   Special Requests   Final    BOTTLES DRAWN AEROBIC AND ANAEROBIC Blood Culture results may not be optimal due to an excessive volume of blood received in culture bottles   Culture   Final    NO GROWTH 2 DAYS Performed at [facility], [place] [address], [place], [place] [address]    Report Status PENDING  Incomplete  SARS Coronavirus 2 (CEPHEID - Performed in [place] hospital lab), Hosp Order     Status: Abnormal   Collection Time: 04/26/2019 12:01 PM   Specimen: Nasopharyngeal Swab  Result Value Ref Range Status   SARS Coronavirus 2 POSITIVE (A) NEGATIVE Final    Comment: RESULT CALLED TO, READ BACK BY AND VERIFIED WITH: Claretta Fraise RN 13:45 04/26/2019 ([email or web address]) (NOTE) If result is NEGATIVE SARS-CoV-2 target nucleic acids are NOT DETECTED. The SARS-CoV-2 RNA is generally detectable in upper and lower  respiratory specimens during the acute phase of infection. The lowest  concentration of SARS-CoV-2 viral  copies this assay can detect is 250  copies / mL. A negative result does not preclude SARS-CoV-2 infection  and should not be used as the sole basis for treatment or other  patient management decisions.  A negative result may occur with  improper specimen collection / handling, submission of specimen other  than nasopharyngeal swab, presence of viral mutation(s) within the  areas targeted by this assay, and inadequate number of viral copies  (<250 copies / mL). A negative result must be combined with clinical  observations, patient history, and epidemiological information. If result is POSITIVE SARS-CoV-2 target nucleic acids are DETECTED.  The SARS-CoV-2 RNA is generally detectable in upper and lower  respiratory specimens during the acute phase of infection.  Positive  results are indicative of active infection with SARS-CoV-2.  Clinical  correlation with patient history and other diagnostic information is  necessary to determine patient infection status.  Positive results do  not rule out bacterial infection or co-infection with other viruses. If result is PRESUMPTIVE POSTIVE SARS-CoV-2 nucleic acids MAY BE PRESENT.   A presumptive positive result was obtained on the submitted specimen  and confirmed on repeat testing.  While 2019 novel coronavirus  (SARS-CoV-2) nucleic acids may be present in the submitted sample  additional confirmatory testing may be necessary for epidemiological  and / or clinical management purposes  to differentiate between  SARS-CoV-2 and other Sarbecovirus currently known to infect humans.  If clinically indicated additional testing with an alternate test  methodology [phone]) i s advised. The SARS-CoV-2 RNA is generally  detectable in upper and lower respiratory specimens during the acute  phase of infection. The expected result is Negative. Fact Sheet for Patients:  StrictlyIdeas.no Fact Sheet for Healthcare Providers:  BankingDealers.co.za This test is not yet approved or cleared by the [place] FDA and has been authorized for detection and/or diagnosis of SARS-CoV-2 by FDA under an Emergency Use Authorization (EUA).  This EUA will  remain in effect (meaning this test can be used) for the duration of the COVID-19 declaration under Section 564(b)(1) of the Act, 21 U.S.C. section 360bbb-3(b)(1), unless the authorization is terminated or revoked sooner. Performed at [facility], [place] [address], [place], [place] [address]       Radiology Studies: No results found.  Marzetta Board, MD, PhD [facility]  Contact via  [email or web address]  [place] P: [phone] F: [phone]

## 2019-04-13 LAB — CBC WITH DIFFERENTIAL/PLATELET
Abs Immature Granulocytes: 0.06 10*3/uL (ref 0.00–0.07)
Basophils Absolute: 0 10*3/uL (ref 0.0–0.1)
Basophils Relative: 0 %
Eosinophils Absolute: 0 10*3/uL (ref 0.0–0.5)
Eosinophils Relative: 0 %
HCT: 32.5 % — ABNORMAL LOW (ref 39.0–52.0)
Hemoglobin: 10.1 g/dL — ABNORMAL LOW (ref 13.0–17.0)
Immature Granulocytes: 1 %
Lymphocytes Relative: 8 %
Lymphs Abs: 0.6 10*3/uL — ABNORMAL LOW (ref 0.7–4.0)
MCH: 28.9 pg (ref 26.0–34.0)
MCHC: 31.1 g/dL (ref 30.0–36.0)
MCV: 93.1 fL (ref 80.0–100.0)
Monocytes Absolute: 0.7 10*3/uL (ref 0.1–1.0)
Monocytes Relative: 11 %
Neutro Abs: 5.5 10*3/uL (ref 1.7–7.7)
Neutrophils Relative %: 80 %
Platelets: 238 10*3/uL (ref 150–400)
RBC: 3.49 MIL/uL — ABNORMAL LOW (ref 4.22–5.81)
RDW: 13.8 % (ref 11.5–15.5)
WBC: 6.8 10*3/uL (ref 4.0–10.5)
nRBC: 0 % (ref 0.0–0.2)

## 2019-04-13 LAB — COMPREHENSIVE METABOLIC PANEL
ALT: 83 U/L — ABNORMAL HIGH (ref 0–44)
AST: 38 U/L (ref 15–41)
Albumin: 2.6 g/dL — ABNORMAL LOW (ref 3.5–5.0)
Alkaline Phosphatase: 81 U/L (ref 38–126)
Anion gap: 8 (ref 5–15)
BUN: 40 mg/dL — ABNORMAL HIGH (ref 8–23)
CO2: 22 mmol/L (ref 22–32)
Calcium: 8 mg/dL — ABNORMAL LOW (ref 8.9–10.3)
Chloride: 118 mmol/L — ABNORMAL HIGH (ref 98–111)
Creatinine, Ser: 0.78 mg/dL (ref 0.61–1.24)
GFR calc Af Amer: >60 mL/min (ref >60)
GFR calc non Af Amer: >60 mL/min (ref >60)
Glucose, Bld: 163 mg/dL — ABNORMAL HIGH (ref 70–99)
Potassium: 3.7 mmol/L (ref 3.5–5.1)
Sodium: 148 mmol/L — ABNORMAL HIGH (ref 135–145)
Total Bilirubin: 0.4 mg/dL (ref 0.3–1.2)
Total Protein: 5.4 g/dL — ABNORMAL LOW (ref 6.5–8.1)

## 2019-04-13 LAB — PHOSPHORUS: Phosphorus: 2.4 mg/dL — ABNORMAL LOW (ref 2.5–4.6)

## 2019-04-13 LAB — C-REACTIVE PROTEIN: CRP: 2.3 mg/dL — ABNORMAL HIGH (ref <1.0)

## 2019-04-13 LAB — FERRITIN: Ferritin: 250 ng/mL (ref 24–336)

## 2019-04-13 LAB — D-DIMER, QUANTITATIVE: D-Dimer, Quant: 0.82 ug/mL-FEU — ABNORMAL HIGH (ref 0.00–0.50)

## 2019-04-13 LAB — MAGNESIUM: Magnesium: 2.1 mg/dL (ref 1.7–2.4)

## 2019-04-13 NOTE — Evaluation (Signed)
Occupational Therapy Evaluation Patient Details Name: Thomas Bridges [facility] MRN: [id number] DOB: 05/06/1946 Today's Date: 04/13/2019    History of Present Illness Pt adm with fever, PNA, and Covid positive. PMH - advanced dementia, HTN   Clinical Impression   PTA Pt lived at home with wife - who assisted him with ADL and performed all IADL. He communicated with 1-2 word phrases and recognized her most of the time. Pt got around with assist from RW and min external assist. Today Pt is total A for all aspects of ADL and max A +2 with attempted transfer with RW and face to face hand held assist. Pt pulls away with attempts to engage him in ADL. VSS throughout session on RA. However, Pt moaning inconsistently throughout session. OT currently recommending SNF level follow up post-acute due to difficulty in transfers. Otherwise as always with advanced dementia patients our goal would be to return them to a familiar environment (home) however, he needs too much assist at this time for that to happen safely. OT will follow acutely. Next session to attempt transfer again, and focus on ROM BUE and sitting balance - engaging in functional tasks.     Follow Up Recommendations  SNF;Supervision/Assistance - 24 hour    Equipment Recommendations  Other (comment)(defer to next venue)    Recommendations for Other Services       Precautions / Restrictions Precautions Precautions: Fall Restrictions Weight Bearing Restrictions: No      Mobility Bed Mobility Overal bed mobility: Needs Assistance Bed Mobility: Supine to Sit;Sit to Supine     Supine to sit: Total assist Sit to supine: Total assist;+2 for physical assistance;+2 for safety/equipment   General bed mobility comments: Assist for all aspects  Transfers Overall transfer level: Needs assistance Equipment used: Rolling walker (2 wheeled);2 person hand held assist Transfers: Sit to/from Stand Sit to Stand: Max assist;+2 physical assistance;+2  safety/equipment;From elevated surface         General transfer comment: Pt would not keep hands on RW (thought we would try with familiar cue) - pushed RW away. With 2 person face to face Pt was able to lift bottom off bed but unable to come fully upright even with max multimodal cues and assist from bed pad    Balance Overall balance assessment: Needs assistance Sitting-balance support: Bilateral upper extremity supported;Feet supported Sitting balance-Leahy Scale: Poor Sitting balance - Comments: required min A while sitting EOB for approx 10 min Postural control: Posterior lean Standing balance support: Bilateral upper extremity supported Standing balance-Leahy Scale: Zero Standing balance comment: dependent on external support from therapists                           ADL either performed or assessed with clinical judgement   ADL Overall ADL's : Needs assistance/impaired                                       General ADL Comments: currently total A for ADL - pulled hand away while trying to engage in face washing     Vision         Perception     Praxis      Pertinent Vitals/Pain Pain Assessment: Faces Faces Pain Scale: Hurts a little bit Pain Location: generalized retracts to touch on RUE, neck Pain Descriptors / Indicators: Discomfort;Grimacing Pain Intervention(s): Limited activity within patient's tolerance;Monitored  during session;Repositioned     Hand Dominance     Extremity/Trunk Assessment Upper Extremity Assessment Upper Extremity Assessment: Difficult to assess due to impaired cognition(spontaneous movement, grasp)   Lower Extremity Assessment Lower Extremity Assessment: Defer to PT evaluation   Cervical / Trunk Assessment Cervical / Trunk Assessment: Normal   Communication Communication Communication: Other (comment)(moaning throughout session)   Cognition Arousal/Alertness: Awake/alert(waxed and wained throughout  session) Behavior During Therapy: Restless Overall Cognitive Status: No family/caregiver present to determine baseline cognitive functioning                                 General Comments: At baseline pt with significant dementia. Prior to illness pt would answer in 1-2 words. Didn't always recognize wife   General Comments  VSS throughout session, on RA and saturations 97 or above    Exercises     Shoulder Instructions      Home Living Family/patient expects to be discharged to:: Private residence Living Arrangements: Spouse/significant other Available Help at Discharge: Family                         Home Equipment: Gilford Rile - 2 wheels;Bedside commode   Additional Comments: walking with RW and asssist from wife. minimally verbal at baseline      Prior Functioning/Environment Level of Independence: Needs assistance  Gait / Transfers Assistance Needed: Assist to amb. Uses BSC with assist. ADL's / Homemaking Assistance Needed: Needs assist with feeding            OT Problem List: Decreased strength;Decreased safety awareness;Decreased knowledge of use of DME or AE;Decreased activity tolerance;Impaired balance (sitting and/or standing);Cardiopulmonary status limiting activity;Decreased cognition      OT Treatment/Interventions: Self-care/ADL training;Therapeutic activities;Patient/family education;Balance training    OT Goals(Current goals can be found in the care plan section) Acute Rehab OT Goals Patient Stated Goal: Pt unable to state OT Goal Formulation: With patient Time For Goal Achievement: 05-26-2019 Potential to Achieve Goals: Fair ADL Goals Pt Will Perform Grooming: with min assist;sitting Pt Will Transfer to Toilet: with mod assist;stand pivot transfer;bedside commode Pt Will Perform Toileting - Clothing Manipulation and hygiene: with max assist;sitting/lateral leans Additional ADL Goal #1: Pt will perform bed mobility prior to engaging  in ADL session at mod A  OT Frequency: Min 1X/week   Barriers to D/C:    currently requires +2 assist for bed mobility and attemted transfers. However, as always with a Pt with advanced dementia - our #1 goal would be to get them at a level to go to a familiar environment where family could take care of him       Co-evaluation PT/OT/SLP Co-Evaluation/Treatment: Yes Reason for Co-Treatment: Necessary to address cognition/behavior during functional activity;For patient/therapist safety;To address functional/ADL transfers PT goals addressed during session: Mobility/safety with mobility;Balance OT goals addressed during session: ADL's and self-care;Strengthening/ROM      AM-PAC OT "6 Clicks" Daily Activity     Outcome Measure Help from another person eating meals?: Total Help from another person taking care of personal grooming?: Total Help from another person toileting, which includes using toliet, bedpan, or urinal?: Total Help from another person bathing (including washing, rinsing, drying)?: Total Help from another person to put on and taking off regular upper body clothing?: Total Help from another person to put on and taking off regular lower body clothing?: Total 6 Click Score: 6   End of  Session Equipment Utilized During Treatment: Surveyor, mining Communication: Mobility status  Activity Tolerance: Patient limited by lethargy Patient left: in bed;with call bell/phone within reach;with bed alarm set  OT Visit Diagnosis: Unsteadiness on feet (R26.81);Other abnormalities of gait and mobility (R26.89);Muscle weakness (generalized) (M62.81);Feeding difficulties (R63.3);Other symptoms and signs involving cognitive function                Time: 8978-4784 OT Time Calculation (min): 26 min Charges:  OT General Charges $OT Visit: 1 Visit OT Evaluation $OT Eval Moderate Complexity: [place] OTR/L Acute Rehabilitation Services Pager: [phone] Office:  [place] 04/13/2019, 10:10 AM

## 2019-04-13 NOTE — Progress Notes (Signed)
Gave wife update and she did Face Time with patient.

## 2019-04-13 NOTE — Progress Notes (Signed)
Physical Therapy Treatment Patient Details Name: Thomas Bridges MRN: [id number] DOB: 11-Jun-1946 Today's Date: 04/13/2019    History of Present Illness Pt adm with fever, PNA, and Covid positive. PMH - advanced dementia, HTN    PT Comments    Patient seen in co-treat with OT for safety due to patient's decreased cognitive status and ability to follow commands. Patient was awake throughout session, however did not follow any commands or engage in activity once initiated for him (washing face, sit to stand, etc). Patient was ambulatory with RW PTA (per discussion with wife by prior PT). Feel this warrants a continued trial of PT.    Follow Up Recommendations  SNF     Equipment Recommendations  None recommended by PT    Recommendations for Other Services       Precautions / Restrictions Precautions Precautions: Fall Restrictions Weight Bearing Restrictions: No    Mobility  Bed Mobility Overal bed mobility: Needs Assistance Bed Mobility: Supine to Sit;Sit to Supine     Supine to sit: Total assist Sit to supine: Total assist;+2 for physical assistance;+2 for safety/equipment   General bed mobility comments: Assist for all aspects  Transfers Overall transfer level: Needs assistance Equipment used: Rolling walker (2 wheeled);2 person hand held assist Transfers: Sit to/from Stand Sit to Stand: +2 physical assistance;+2 safety/equipment;From elevated surface;Total assist         General transfer comment: Placing RW in front of patient did not spark any memory of how to use device; attempted standing x2 with RW and x2 with no device and 2 person assist; pt demonstrated no initiation of standing with weight bearing  Ambulation/Gait                 Stairs             Wheelchair Mobility    Modified Rankin (Stroke Patients Only)       Balance Overall balance assessment: Needs assistance Sitting-balance support: Bilateral upper extremity supported;Feet  supported Sitting balance-Leahy Scale: Poor Sitting balance - Comments: required min A while sitting EOB for approx 10 min Postural control: Posterior lean Standing balance support: Bilateral upper extremity supported Standing balance-Leahy Scale: Zero Standing balance comment: dependent on external support from therapists                            Cognition Arousal/Alertness: Awake/alert(waxed and wained throughout session) Behavior During Therapy: Restless Overall Cognitive Status: No family/caregiver present to determine baseline cognitive functioning                                 General Comments: At baseline pt with significant dementia. Prior to illness pt would answer in 1-2 words. Didn't always recognize wife      Exercises      General Comments General comments (skin integrity, edema, etc.): VSS      Pertinent Vitals/Pain Pain Assessment: Faces Faces Pain Scale: Hurts a little bit Pain Location: generalized retracts to touch on RUE, neck Pain Descriptors / Indicators: Discomfort;Grimacing Pain Intervention(s): Limited activity within patient's tolerance;Monitored during session    [place] expects to be discharged to:: Private residence Living Arrangements: Spouse/significant other Available Help at Discharge: Family         Home Equipment: Gilford Rile - 2 wheels;Bedside commode Additional Comments: walking with RW and asssist from wife. minimally verbal at baseline    Prior  Function Level of Independence: Needs assistance  Gait / Transfers Assistance Needed: Assist to amb. Uses BSC with assist. ADL's / Homemaking Assistance Needed: Needs assist with feeding     PT Goals (current goals can now be found in the care plan section) Acute Rehab PT Goals Patient Stated Goal: Pt unable to state Time For Goal Achievement: 04/25/19 Progress towards PT goals: Not progressing toward goals - comment(decr cognitive status)     Frequency    Min 3X/week      PT Plan Current plan remains appropriate    Co-evaluation PT/OT/SLP Co-Evaluation/Treatment: Yes Reason for Co-Treatment: Necessary to address cognition/behavior during functional activity;For patient/therapist safety PT goals addressed during session: Mobility/safety with mobility;Balance OT goals addressed during session: ADL's and self-care;Strengthening/ROM      AM-PAC PT "6 Clicks" Mobility   Outcome Measure  Help needed turning from your back to your side while in a flat bed without using bedrails?: Total Help needed moving from lying on your back to sitting on the side of a flat bed without using bedrails?: Total Help needed moving to and from a bed to a chair (including a wheelchair)?: Total Help needed standing up from a chair using your arms (e.g., wheelchair or bedside chair)?: Total Help needed to walk in hospital room?: Total Help needed climbing 3-5 steps with a railing? : Total 6 Click Score: 6    End of Session   Activity Tolerance: Patient tolerated treatment well Patient left: in bed;with call bell/phone within reach;with bed alarm set Nurse Communication: Mobility status PT Visit Diagnosis: Other abnormalities of gait and mobility (R26.89);Difficulty in walking, not elsewhere classified (R26.2)     Time: 1761-6073 PT Time Calculation (min) (ACUTE ONLY): 25 min  Charges:  $Therapeutic Activity: 8-22 mins                        [employer], PT 04/13/2019, 11:46 AM

## 2019-04-13 NOTE — Progress Notes (Signed)
Clinical/Bedside Swallow Evaluation Patient Details  Name: Thomas Bridges MRN: [id number] Date of Birth: [birth year]  Today's Date: 04/13/2019 Time: SLP Start Time (ACUTE ONLY): 77 SLP Stop Time (ACUTE ONLY): 1155 SLP Time Calculation (min) (ACUTE ONLY): 32 min  Past Medical History:  Past Medical History:  Diagnosis Date  . Chest pain   . HTN (hypertension)   . SOB (shortness of breath)    Past Surgical History:  Past Surgical History:  Procedure Laterality Date  . CARDIAC CATHETERIZATION     HPI:  73 year old male with history of advanced dementia admitted on 04/05/2019 after about 4 days of decreasing his usual functionality as well as apparent worsening confusion.  He also had been having poor p.o. intake and weakness, at baseline he can answer 1-2 word sentences.  He was diagnosed with COVID-19,   Assessment / Plan / Recommendation Clinical Impression  Pt found moaning in bed.  Repositioned in an effort to improve comfort.  Oral care provided, removing dried secretions from soft and hard palate.  Pt in persistent open mouth posture with eyes closed; unable to follow commands or verbalize intentionally.  He did accept boluses of ice chips, thin liquids, with total assistance to attend and initiate bolus manipulation.  He continued to maintain open mouth posture throughout exam, intermittently yelling out.  Delayed swallow response was initiated, followed by intermittent coughing with thin liquids.  NO solids were offered given poor awareness.  Recommend continuing NPO for now; Continue frequent oral care and allow ice chips.  SLP will follow for improvements/readiness to resume oral diet.  D/W RN and sign posted above bed.  SLP Visit Diagnosis: Dysphagia, unspecified (R13.10)    Aspiration Risk  Severe aspiration risk    Diet Recommendation   NPO except ice chips  Medication Administration: Via alternative means    Other  Recommendations Oral Care Recommendations: Oral care  QID;Oral care prior to ice chip/H20   Follow up Recommendations Skilled Nursing facility      Frequency and Duration min 3x week  2 weeks       Prognosis Prognosis for Safe Diet Advancement: Fair      Swallow Study   General Date of Onset: 04/20/2019 HPI: 73 year old male with history of advanced dementia admitted on 04/28/2019 after about 4 days of decreasing his usual functionality as well as apparent worsening confusion.  He also has been having poor p.o. intake and weakness, at baseline he can answer 1-2 word sentences.  He was diagnosed with COVID-19, Type of Study: Bedside Swallow Evaluation Previous Swallow Assessment: no Diet Prior to this Study: Dysphagia 3 (soft);Thin liquids Temperature Spikes Noted: No Respiratory Status: Room air History of Recent Intubation: No Behavior/Cognition: Agitated Oral Cavity Assessment: Dried secretions Oral Care Completed by SLP: Yes Oral Cavity - Dentition: Edentulous Self-Feeding Abilities: Total assist Patient Positioning: Upright in bed Baseline Vocal Quality: (no discernable speech) Volitional Cough: Cognitively unable to elicit Volitional Swallow: Unable to elicit    Oral/Motor/Sensory Function Overall Oral Motor/Sensory Function: Other (comment)(symmetric at baseline)   Ice Chips Ice chips: Impaired Presentation: Spoon Oral Phase Impairments: Reduced labial seal;Reduced lingual movement/coordination;Poor awareness of bolus Oral Phase Functional Implications: Prolonged oral transit;Oral residue;Oral holding Pharyngeal Phase Impairments: Suspected delayed Swallow   Thin Liquid Thin Liquid: Impaired Presentation: Spoon Oral Phase Impairments: Reduced labial seal;Reduced lingual movement/coordination;Poor awareness of bolus Oral Phase Functional Implications: Prolonged oral transit;Oral holding Pharyngeal  Phase Impairments: Wet Vocal Quality;Suspected delayed Swallow;Cough - Delayed    Nectar Thick Nectar Thick  Liquid: Not tested    Honey Thick Honey Thick Liquid: Not tested   Puree Puree: Not tested   Solid     Solid: Not tested      Thomas Bridges 04/13/2019,12:11 PM  Thomas Bridges, [place] Office number [phone]

## 2019-04-13 NOTE — Progress Notes (Signed)
PROGRESS NOTE  Thomas Bridges [facility] YPP:[id number] DOB: 1945/11/25 DOA: 04/13/2019 PCP: Josetta Huddle, MD   LOS: 4 days   Brief Narrative / Interim history: 73 year old male with history of advanced dementia who came into the hospital and was admitted on 04/01/2019 after about 4 days of decreasing his usual functionality as well as apparent worsening confusion.  He also has been having poor p.o. intake and weakness, at baseline he can answer 1-2 word sentences however has not spoken for the past 3 days.  He was diagnosed with COVID-19, chest x-ray on admission was concerning for pneumonia and was admitted to the hospital.  Subjective: Overall stable but minimally interactive, open his eyes when called and tracks me but does not talk and intermittently moans  Assessment & Plan: Active Problems:   Pneumonia due to COVID-19 virus   CAP (community acquired pneumonia)   AKI (acute kidney injury) ([place])   Dementia ([place])   Principal Problem Acute Hypoxic Respiratory Failure due to Covid-19 Viral Illness /concern for CAP/lobar pneumonia -Patient's procalcitonin was slightly elevated and he was started on ceftriaxone and azithromycin, will continue that for 5 days, today day 4/5 -He was also started on steroids, continue -Inflammatory markers improving -PT recommended SNF, discussed with wife over the phone and she is agreeable.  Consult Education officer, museum. -His mental status is waxing and waning, bedside RN concern for swallow safety.  I have consulted speech   COVID-19 Labs  Recent Labs    04/11/19 0152 04/12/19 0335 04/13/19 0341  DDIMER 0.70* 0.66* 0.82*  FERRITIN 318 383* 250  CRP 9.2* 4.4* 2.3*    Lab Results  Component Value Date   SARSCOV2NAA POSITIVE (A) 04/20/2019    Active Problems Hypertension -Stable blood pressure  Acute kidney injury -Creatinine has normalized  Advanced dementia -Continue home Aricept  Hypernatremia -Likely in the setting of no p.o. intake, start  dextrose water  Goals of care -Discussed with the patient's wife over the phone the fact that he is not able to eat and drink anything right now.  Speech therapy supposed to evaluate patient shortly.  When asked about what with the patient's wishes would be at this point if you were not able to eat and drink anything, she tells me that he would say "do not worry about it".  She does not want to prolong his suffering unnecessarily.  We have discussed that the goal is for patient to go to SNF however if he is not able to eat and drink anything and if this is persistent he may need to be referred to hospice.  She expressed understanding   Scheduled Meds:  donepezil  10 mg Oral QHS   enoxaparin (LOVENOX) injection  40 mg Subcutaneous Q24H   feeding supplement (ENSURE ENLIVE)  237 mL Oral TID BM   mouth rinse  15 mL Mouth Rinse BID   methylPREDNISolone (SOLU-MEDROL) injection  30 mg Intravenous Q12H   vitamin C  500 mg Oral Daily   zinc sulfate  220 mg Oral Daily   Continuous Infusions:  azithromycin 500 mg (04/12/19 2138)   cefTRIAXone (ROCEPHIN)  IV 1 g (04/12/19 2102)   dextrose 50 mL/hr at 04/12/19 1735   remdesivir 100 mg in NS 250 mL 100 mg (04/12/19 2254)   PRN Meds:.  DVT prophylaxis: Lovenox Code Status: DNR Family Communication: d/w wife [name] over the phone, [phone] Disposition Plan: TBD  Consultants:   None   Procedures:   None     Objective: Vitals:  04/13/19 0323 04/13/19 0400 04/13/19 0800 04/13/19 1000  BP: (!) 157/67  (!) 151/114 (!) 141/73  Pulse:  65 66 72  Resp:  15    Temp: 99.1 F (37.3 C)  99.2 F (37.3 C)   TempSrc: Axillary  Axillary   SpO2:  100% 100% 100%  Weight:      Height:        Intake/Output Summary (Last 24 hours) at 04/13/2019 1137 Last data filed at 04/13/2019 0400 Gross per 24 hour  Intake 626.63 ml  Output 1100 ml  Net -473.37 ml   Filed Weights   04/02/2019 2030 04/16/2019 2033  Weight: 64.4 kg 64.4 kg     Examination:  Constitutional: No distress, lying in bed Eyes: No scleral icterus ENMT: Dry mucous membranes Neck: No JVD Respiratory: shallow breathing, diminished at the bases, no wheezing or crackles heard Cardiovascular: Regular rate and rhythm, no murmurs appreciated.  No peripheral edema Abdomen: Soft, nontender, nondistended, bowel sounds positive Musculoskeletal: no clubbing / cyanosis. Skin: No new rashes Neurologic: Withdraws from pain, does not follow commands   Data Reviewed: I have independently reviewed following labs and imaging studies  CBC: Recent Labs  Lab 05/01/2019 1123 04/10/19 0500 04/11/19 0152 04/12/19 0335 04/13/19 0341  WBC 4.9 6.2 7.7 5.8 6.8  NEUTROABS 4.5 5.6 6.6 4.8 5.5  HGB 10.6* 10.6* 10.1* 10.1* 10.1*  HCT 33.2* 34.5* 31.3* 32.3* 32.5*  MCV 94.3 94.8 92.9 94.4 93.1  PLT 177 159 165 207 761   Basic Metabolic Panel: Recent Labs  Lab 04/05/2019 1123 04/10/19 0500 04/11/19 0152 04/12/19 0335 04/13/19 0341  NA 141 141 145 148* 148*  K 3.8 4.1 3.9 4.0 3.7  CL 109 109 114* 117* 118*  CO2 21* 19* 22 22 22   GLUCOSE 129* 164* 175* 151* 163*  BUN 25* 24* 37* 37* 40*  CREATININE 1.26* 0.74 0.81 0.73 0.78  CALCIUM 8.2* 7.8* 8.0* 8.0* 8.0*  MG  --  1.8 2.0 2.1 2.1  PHOS  --  2.2* 1.9* 2.6 2.4*   GFR: Estimated Creatinine Clearance: 73.8 mL/min (by C-G formula based on SCr of 0.78 mg/dL). Liver Function Tests: Recent Labs  Lab 04/05/2019 1123 04/10/19 0500 04/11/19 0152 04/12/19 0335 04/13/19 0341  AST 37 29 27 66* 38  ALT 35 33 32 85* 83*  ALKPHOS 106 90 93 93 81  BILITOT 0.4 0.3 0.3 0.3 0.4  PROT 5.9* 6.0* 5.7* 5.9* 5.4*  ALBUMIN 2.7* 2.7* 2.6* 2.7* 2.6*   No results for input(s): LIPASE, AMYLASE in the last 168 hours. No results for input(s): AMMONIA in the last 168 hours. Coagulation Profile: Recent Labs  Lab 04/05/2019 1123  INR 1.1   Cardiac Enzymes: No results for input(s): CKTOTAL, CKMB, CKMBINDEX, TROPONINI in the  last 168 hours. BNP (last 3 results) No results for input(s): PROBNP in the last 8760 hours. HbA1C: No results for input(s): HGBA1C in the last 72 hours. CBG: Recent Labs  Lab 04/02/2019 1109  GLUCAP 133*   Lipid Profile: No results for input(s): CHOL, HDL, LDLCALC, TRIG, CHOLHDL, LDLDIRECT in the last 72 hours. Thyroid Function Tests: No results for input(s): TSH, T4TOTAL, FREET4, T3FREE, THYROIDAB in the last 72 hours. Anemia Panel: Recent Labs    04/12/19 0335 04/13/19 0341  FERRITIN 383* 250   Urine analysis:    Component Value Date/Time   COLORURINE YELLOW 04/14/2019 1123   APPEARANCEUR HAZY (A) 04/30/2019 1123   LABSPEC 1.026 04/06/2019 1123   PHURINE 5.0 04/26/2019 1123  GLUCOSEU NEGATIVE 04/19/2019 [place] 04/29/2019 1123   [place] 04/28/2019 1123   [place] 04/04/2019 1123   PROTEINUR 100 (A) 04/08/2019 1123   NITRITE NEGATIVE 04/19/2019 1123   LEUKOCYTESUR NEGATIVE 04/26/2019 1123   Sepsis Labs: Invalid input(s): PROCALCITONIN, LACTICIDVEN  Recent Results (from the past 240 hour(s))  Blood Culture (routine x 2)     Status: None (Preliminary result)   Collection Time: 04/26/2019 11:23 AM   Specimen: BLOOD RIGHT FOREARM  Result Value Ref Range Status   Specimen Description BLOOD RIGHT FOREARM  Final   Special Requests   Final    BOTTLES DRAWN AEROBIC AND ANAEROBIC Blood Culture adequate volume   Culture   Final    NO GROWTH 3 DAYS Performed at [facility], [address] [place], [place] [address]    Report Status PENDING  Incomplete  Urine culture     Status: None   Collection Time: 04/20/2019 11:23 AM   Specimen: In/Out Cath Urine  Result Value Ref Range Status   Specimen Description IN/OUT CATH URINE  Final   Special Requests NONE  Final   Culture   Final    NO GROWTH Performed at [facility], [place] [address] [place], [place] [address]    Report Status 04/10/2019 FINAL  Final  Blood Culture  (routine x 2)     Status: None (Preliminary result)   Collection Time: 04/01/2019 11:28 AM   Specimen: BLOOD  Result Value Ref Range Status   Specimen Description BLOOD LEFT ANTECUBITAL  Final   Special Requests   Final    BOTTLES DRAWN AEROBIC AND ANAEROBIC Blood Culture results may not be optimal due to an excessive volume of blood received in culture bottles   Culture   Final    NO GROWTH 3 DAYS Performed at [facility], [place] [address], [place], [place] [address]    Report Status PENDING  Incomplete  SARS Coronavirus 2 (CEPHEID - Performed in [facility]), Hosp Order     Status: Abnormal   Collection Time: 04/28/2019 12:01 PM   Specimen: Nasopharyngeal Swab  Result Value Ref Range Status   SARS Coronavirus 2 POSITIVE (A) NEGATIVE Final    Comment: RESULT CALLED TO, READ BACK BY AND VERIFIED WITH: Claretta Fraise RN 13:45 04/16/2019 ([email or web address]) (NOTE) If result is NEGATIVE SARS-CoV-2 target nucleic acids are NOT DETECTED. The SARS-CoV-2 RNA is generally detectable in upper and lower  respiratory specimens during the acute phase of infection. The lowest  concentration of SARS-CoV-2 viral copies this assay can detect is 250  copies / mL. A negative result does not preclude SARS-CoV-2 infection  and should not be used as the sole basis for treatment or other  patient management decisions.  A negative result may occur with  improper specimen collection / handling, submission of specimen other  than nasopharyngeal swab, presence of viral mutation(s) within the  areas targeted by this assay, and inadequate number of viral copies  (<250 copies / mL). A negative result must be combined with clinical  observations, patient history, and epidemiological information. If result is POSITIVE SARS-CoV-2 target nucleic acids are DETECTED.  The SARS-CoV-2 RNA is generally detectable in upper and lower  respiratory specimens during the acute phase of infection.  Positive  results are  indicative of active infection with SARS-CoV-2.  Clinical  correlation with patient history and other diagnostic information is  necessary to determine patient infection status.  Positive  results do  not rule out bacterial infection or co-infection with other viruses. If result is PRESUMPTIVE POSTIVE SARS-CoV-2 nucleic acids MAY BE PRESENT.   A presumptive positive result was obtained on the submitted specimen  and confirmed on repeat testing.  While 2019 novel coronavirus  (SARS-CoV-2) nucleic acids may be present in the submitted sample  additional confirmatory testing may be necessary for epidemiological  and / or clinical management purposes  to differentiate between  SARS-CoV-2 and other Sarbecovirus currently known to infect humans.  If clinically indicated additional testing with an alternate test  methodology [phone]) i s advised. The SARS-CoV-2 RNA is generally  detectable in upper and lower respiratory specimens during the acute  phase of infection. The expected result is Negative. Fact Sheet for Patients:  StrictlyIdeas.no Fact Sheet for Healthcare Providers: BankingDealers.co.za This test is not yet approved or cleared by the [place] FDA and has been authorized for detection and/or diagnosis of SARS-CoV-2 by FDA under an Emergency Use Authorization (EUA).  This EUA will remain in effect (meaning this test can be used) for the duration of the COVID-19 declaration under Section 564(b)(1) of the Act, 21 U.S.C. section 360bbb-3(b)(1), unless the authorization is terminated or revoked sooner. Performed at [facility], [place] [address], [place], [place] [address]       Radiology Studies: No results found.  Marzetta Board, MD, PhD [facility]  Contact via  [email or web address]  [place] P: [phone] F: [phone]

## 2019-04-14 DIAGNOSIS — Z515 Encounter for palliative care: Secondary | ICD-10-CM

## 2019-04-14 DIAGNOSIS — U071 COVID-19: Secondary | ICD-10-CM

## 2019-04-14 DIAGNOSIS — F0390 Unspecified dementia without behavioral disturbance: Secondary | ICD-10-CM

## 2019-04-14 LAB — CBC WITH DIFFERENTIAL/PLATELET
Abs Immature Granulocytes: 0.08 10*3/uL — ABNORMAL HIGH (ref 0.00–0.07)
Basophils Absolute: 0 10*3/uL (ref 0.0–0.1)
Basophils Relative: 0 %
Eosinophils Absolute: 0 10*3/uL (ref 0.0–0.5)
Eosinophils Relative: 0 %
HCT: 31.9 % — ABNORMAL LOW (ref 39.0–52.0)
Hemoglobin: 10 g/dL — ABNORMAL LOW (ref 13.0–17.0)
Immature Granulocytes: 1 %
Lymphocytes Relative: 8 %
Lymphs Abs: 0.6 10*3/uL — ABNORMAL LOW (ref 0.7–4.0)
MCH: 28.7 pg (ref 26.0–34.0)
MCHC: 31.3 g/dL (ref 30.0–36.0)
MCV: 91.7 fL (ref 80.0–100.0)
Monocytes Absolute: 0.8 10*3/uL (ref 0.1–1.0)
Monocytes Relative: 12 %
Neutro Abs: 5.3 10*3/uL (ref 1.7–7.7)
Neutrophils Relative %: 79 %
Platelets: 253 10*3/uL (ref 150–400)
RBC: 3.48 MIL/uL — ABNORMAL LOW (ref 4.22–5.81)
RDW: 13.7 % (ref 11.5–15.5)
WBC: 6.7 10*3/uL (ref 4.0–10.5)
nRBC: 0 % (ref 0.0–0.2)

## 2019-04-14 LAB — COMPREHENSIVE METABOLIC PANEL
ALT: 68 U/L — ABNORMAL HIGH (ref 0–44)
AST: 23 U/L (ref 15–41)
Albumin: 2.4 g/dL — ABNORMAL LOW (ref 3.5–5.0)
Alkaline Phosphatase: 82 U/L (ref 38–126)
Anion gap: 7 (ref 5–15)
BUN: 30 mg/dL — ABNORMAL HIGH (ref 8–23)
CO2: 24 mmol/L (ref 22–32)
Calcium: 7.6 mg/dL — ABNORMAL LOW (ref 8.9–10.3)
Chloride: 111 mmol/L (ref 98–111)
Creatinine, Ser: 0.71 mg/dL (ref 0.61–1.24)
GFR calc Af Amer: >60 mL/min (ref >60)
GFR calc non Af Amer: >60 mL/min (ref >60)
Glucose, Bld: 165 mg/dL — ABNORMAL HIGH (ref 70–99)
Potassium: 3.5 mmol/L (ref 3.5–5.1)
Sodium: 142 mmol/L (ref 135–145)
Total Bilirubin: 0.3 mg/dL (ref 0.3–1.2)
Total Protein: 5.2 g/dL — ABNORMAL LOW (ref 6.5–8.1)

## 2019-04-14 LAB — D-DIMER, QUANTITATIVE: D-Dimer, Quant: 1.48 ug/mL-FEU — ABNORMAL HIGH (ref 0.00–0.50)

## 2019-04-14 LAB — CULTURE, BLOOD (ROUTINE X 2)
Culture: NO GROWTH
Culture: NO GROWTH
Special Requests: ADEQUATE

## 2019-04-14 LAB — PHOSPHORUS: Phosphorus: 2.3 mg/dL — ABNORMAL LOW (ref 2.5–4.6)

## 2019-04-14 LAB — FERRITIN: Ferritin: 209 ng/mL (ref 24–336)

## 2019-04-14 LAB — MAGNESIUM: Magnesium: 2 mg/dL (ref 1.7–2.4)

## 2019-04-14 LAB — C-REACTIVE PROTEIN: CRP: 1.2 mg/dL — ABNORMAL HIGH (ref <1.0)

## 2019-04-14 MED ORDER — MORPHINE SULFATE (PF) 2 MG/ML IV SOLN
2.0000 mg | INTRAVENOUS | Status: DC | PRN
  Administered 2019-04-15 – 2019-04-26 (×8): 2 mg via INTRAVENOUS
  Administered 2019-04-27: 4 mg via INTRAVENOUS
  Filled 2019-04-14: qty 2
  Filled 2019-04-14 (×2): qty 1
  Filled 2019-04-14: qty 2
  Filled 2019-04-14 (×5): qty 1

## 2019-04-14 MED ORDER — HALOPERIDOL LACTATE 2 MG/ML PO CONC
0.5000 mg | ORAL | Status: DC | PRN
  Filled 2019-04-14: qty 0.3

## 2019-04-14 MED ORDER — HALOPERIDOL LACTATE 5 MG/ML IJ SOLN: 1.0000 mg | INTRAMUSCULAR | Status: DC | PRN

## 2019-04-14 MED ORDER — HALOPERIDOL 0.5 MG PO TABS
0.5000 mg | ORAL_TABLET | ORAL | Status: DC | PRN
  Filled 2019-04-14: qty 1

## 2019-04-14 NOTE — Progress Notes (Signed)
Manufacturing engineer [facility]) Hospital Liaison note.   Received request from Kingsley Spittle, [place] for family interest in [facility]. Chart reviewed and spoke with family to acknowledge referral.  Unfortunately [facility] is not able to offer a room today. Family and CSW are aware [facility] liaison will follow up with CSW and family tomorrow or sooner if room becomes available. Please do not hesitate to call with questions.   Thank you,      Farrel Gordon, RN, [facility]   [place]     [place] are on AMION

## 2019-04-14 NOTE — TOC Initial Note (Signed)
Transition of Care [facility]) - Initial/Assessment Note    Patient Details  Name: Thomas Bridges MRN: [id number] Date of Birth: [birth year]  [facility] CM/SW Contact:    Weston Anna, LCSW Phone Number: 04/14/2019, 12:23 PM  Clinical Narrative:                  CSW received consult for residential hospice and family preferring [place] reached out to Audrea Muscat to complete referral. At this time [facility] does not have any beds available for today however Audrea Muscat will keep [facility] department updated.   CSW will continue to update.   Disposition: to Residential Hospice facility     Expected Discharge Plan: [place]     Patient Goals and CMS Choice        Expected Discharge Plan and Services Expected Discharge Plan: [place]                                              Prior Living Arrangements/Services                       Activities of Daily Living Home Assistive Devices/Equipment: None ADL Screening (condition at time of admission) Patient's cognitive ability adequate to safely complete daily activities?: No Is the patient deaf or have difficulty hearing?: No Does the patient have difficulty seeing, even when wearing glasses/contacts?: No Does the patient have difficulty concentrating, remembering, or making decisions?: Yes Patient able to express need for assistance with ADLs?: No Does the patient have difficulty dressing or bathing?: Yes Independently performs ADLs?: No Does the patient have difficulty walking or climbing stairs?: Yes Weakness of Legs: Both Weakness of Arms/Hands: Both  Permission Sought/Granted                  Emotional Assessment              Admission diagnosis:  Community acquired pneumonia of right lower lobe of lung ([place]) [J18.1] Sepsis without acute organ dysfunction, due to unspecified organism ([place]) [A41.9] COVID-19 virus detected [U07.1] Patient  Active Problem List   Diagnosis Date Noted  . COVID-19 virus detected   . Comfort measures only status   . Palliative care encounter   . Pneumonia due to COVID-19 virus 04/29/2019  . CAP (community acquired pneumonia) 04/06/2019  . AKI (acute kidney injury) ([place]) 04/20/2019  . Dementia ([place]) 04/08/2019   PCP:  Josetta Huddle, MD Pharmacy:   [facility] [place], [place] - [address] AT [place] [place] [address] Phone: [phone] Fax: [phone]     Social Determinants of Health (SDOH) Interventions    Readmission Risk Interventions No flowsheet data found.

## 2019-04-14 NOTE — Progress Notes (Signed)
Spoke with patient wife on the phone. Gave her an update.

## 2019-04-14 NOTE — Consult Note (Signed)
Consultation Note Date: 04/14/2019   Patient Name: Thomas Bridges [facility]  DOB: 1946/04/30  MRN: [id number]  Age / Sex: 73 y.o., male  PCP: Thomas Huddle, MD Referring Physician: Caren Griffins, MD  Reason for Consultation: Establishing goals of care and Hospice Evaluation  HPI/Patient Profile: 73 y.o. male  with past medical history of advanced dementia who was admitted on 04/21/2019 with decreased functionality and fever or 103-104.  He was found to be positive for COVID 19.  He has been treated supportively with steroids and antiviral medication.  Unfortunately during his hospital stay he has not resumed speaking or eating and drinking despite nearly completing his treatment course.  Clinical Assessment and Goals of Care:  I have reviewed medical records including EPIC notes, labs and imaging, received report from Dr. Cruzita Bridges, and then spoke with his wife  to discuss diagnosis prognosis, GOC, EOL wishes, disposition and options.  I introduced Palliative Medicine as specialized medical care for people living with serious illness. It focuses on providing relief from the symptoms and stress of a serious illness.   We discussed a brief life review of the patient.  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  Thomas Bridges had a career at [employer] for 43 years.  She worked as a Quarry manager on the floor, in the ER and in the [place].   He and Thomas Bridges have a son and a daughter.  His son lives at home with him and helps [place] care for him.   Prior to admission he was able to stand to use the bedside commode and he spoke a few words if he was encouraged to speak.  As far as functional and nutritional status unfortunately he has not spoken or eaten since approximately 7/6.  SLP eval indicates that he can not follow commands to swallow.   Natural disease trajectory and expectations at EOL were discussed.  Hospice and Palliative Care  services outpatient were explained and offered.  Mrs. Thomas Bridges stated that she and the family anticipated Hospice services would be offered.  She was saddened by his declined and grateful for Hospice support.  Questions and concerns were addressed. The family was encouraged to call with questions or concerns.    Primary Decision Maker:  NEXT OF KIN Wife Thomas Bridges    SUMMARY OF RECOMMENDATIONS    Shift towards comfort.  Will leave steroids and lovenox in place until discharge for comfort.  Other interventions not geared towards comfort were discontinued.  PRN haldol added for discomfort or agitation (moaning)  Frequency of PRN morphine adjusted.  Social work consult placed for [employer] at [employer] request.  Anticipate transfer when bed available.  Code Status/Advance Care Planning:  DNR.   Additional Recommendations (Limitations, Scope, Preferences):  Full Comfort Care  Palliative Prophylaxis:   Aspiration  Psycho-social/Spiritual:   Desire for further Chaplaincy support: welcomed  Prognosis:  Less than two weeks given advanced dementia - no longer speaking or swallowing.    Discharge Planning: Hospice facility  Primary Diagnoses: Present on Admission: . Pneumonia due to COVID-19 virus   I have reviewed the medical record, interviewed the patient and family, and examined the patient. The following aspects are pertinent.  Past Medical History:  Diagnosis Date  . Chest pain   . HTN (hypertension)   . SOB (shortness of breath)    Social History   Socioeconomic History  . Marital status: Single    Spouse name: Not on file  . Number of children: Not on file  . Years of education: Not on file  . Highest education level: Not on file  Occupational History  . Not on file  Social Needs  . Financial resource strain: Not on file  . Food insecurity    Worry: Not on file    Inability: Not on file  . Transportation needs    Medical: Not on file     Non-medical: Not on file  Tobacco Use  . Smoking status: Never Smoker  . Smokeless tobacco: Never Used  Substance and Sexual Activity  . Alcohol use: No  . Drug use: No  . Sexual activity: Not on file  Lifestyle  . Physical activity    Days per week: Not on file    Minutes per session: Not on file  . Stress: Not on file  Relationships  . Social Herbalist on phone: Not on file    Gets together: Not on file    Attends religious service: Not on file    Active member of club or organization: Not on file    Attends meetings of clubs or organizations: Not on file    Relationship status: Not on file  Other Topics Concern  . Not on file  Social History Narrative  . Not on file   History reviewed. No pertinent family history. Scheduled Meds: . donepezil  10 mg Oral QHS  . enoxaparin (LOVENOX) injection  40 mg Subcutaneous Q24H  . feeding supplement (ENSURE ENLIVE)  237 mL Oral TID BM  . mouth rinse  15 mL Mouth Rinse BID  . methylPREDNISolone (SOLU-MEDROL) injection  30 mg Intravenous Q12H  . vitamin C  500 mg Oral Daily  . zinc sulfate  220 mg Oral Daily   Continuous Infusions: . dextrose 75 mL/hr at 04/14/19 0400   PRN Meds:.acetaminophen, morphine injection No Known Allergies  Vital Signs: BP (!) 124/57   Pulse 65   Temp 99.1 F (37.3 C) (Axillary)   Resp 16   Ht 5\' 9"  (1.753 m)   Wt 64.4 kg   SpO2 96%   BMI 20.97 kg/m  Pain Scale: PAINAD   Pain Score: 0-No pain   SpO2: SpO2: 96 % O2 Device:SpO2: 96 % O2 Flow Rate: .   IO: Intake/output summary:   Intake/Output Summary (Last 24 hours) at 04/14/2019 1106 Last data filed at 04/14/2019 0500 Gross per 24 hour  Intake 1513.21 ml  Output 1850 ml  Net -336.79 ml    LBM: Last BM Date: 04/11/19 Baseline Weight: Weight: 64.4 kg Most recent weight: Weight: 64.4 kg     Palliative Assessment/Data: 20%     Time In: 11:00 Time Out: 11:53 Time Total: 53 min. Visit consisted of counseling and  education dealing with the complex and emotionally intense issues surrounding the need for palliative care and symptom management in the setting of serious and potentially life-threatening illness. Greater than 50%  of this time was spent counseling and coordinating care related to the above assessment and  plan.  The above conversation was completed via telephone due to the visitor restrictions during the COVID-19 pandemic. Thorough chart review and discussion with necessary members of the care team was completed as part of assessment. All issues were discussed and addressed but no physical exam was performed.  Signed by: Florentina Jenny, PA-C Palliative Medicine Pager: [phone]  Please contact Palliative Medicine Team phone at [phone] for questions and concerns.  For individual provider: See Shea Evans

## 2019-04-14 NOTE — Progress Notes (Signed)
PROGRESS NOTE  [facility] VXB:[id number] DOB: 01-Jan-1946 DOA: 04/26/2019 PCP: Thomas Huddle, MD   LOS: 5 days   Brief Narrative / Interim history: 73 year old male with history of advanced dementia who came into the hospital and was admitted on 04/24/2019 after about 4 days of decreasing his usual functionality as well as apparent worsening confusion.  He also has been having poor p.o. intake and weakness, at baseline he can answer 1-2 word sentences however has not spoken for the past 3 days.  He was diagnosed with COVID-19, chest x-ray on admission was concerning for pneumonia and was admitted to the hospital.  Subjective: Minimally interactive.  Sleeping when I entered the room, opens eyes when interacted with but does not talk  Assessment & Plan: Active Problems:   Pneumonia due to COVID-19 virus   CAP (community acquired pneumonia)   AKI (acute kidney injury) ([place])   Dementia ([place])   Principal Problem Acute Hypoxic Respiratory Failure due to Covid-19 Viral Illness /concern for CAP/lobar pneumonia -Patient's procalcitonin was slightly elevated and he was started on ceftriaxone and azithromycin which was continued for 5 days, along with steroids and Remdesivir.  Steroids are to be continued for total of 10 days -Inflammatory markers continue to improve -Patient's viral illness did not appear to have significant respiratory effect, however given underlying dementia his mental status has been significantly affected, patient is less interactive, with no p.o. intake.  He has failed swallow eval and currently is n.p.o. on IV fluids  COVID-19 Labs  Recent Labs    04/12/19 0335 04/13/19 0341 04/14/19 0420  DDIMER 0.66* 0.82* 1.48*  FERRITIN 383* 250 209  CRP 4.4* 2.3* 1.2*    Lab Results  Component Value Date   SARSCOV2NAA POSITIVE (A) 04/10/2019    Active Problems Hypertension -Blood pressure stable  Acute kidney injury -Creatinine has normalized  Advanced dementia  -Continue home Aricept  Hypernatremia -Likely in the setting of no p.o. intake, started on D5 and now sodium normalized.  Goals of care -Discussed with the patient's wife over the phone the fact that he is not able to eat and drink anything right now.  He has failed swallow eval and currently is n.p.o. When asked about what with the patient's wishes would be at this point if you were not able to eat and drink anything, she tells me that he would say "do not worry about it".  She does not want to prolong his suffering unnecessarily.  We have discussed that the goal is for patient to go to SNF however if he is not able to eat and drink anything and if this is persistent he may need to be referred to hospice.  She expressed understanding.  I have consulted palliative care for an additional layer of support   Scheduled Meds: . donepezil  10 mg Oral QHS  . enoxaparin (LOVENOX) injection  40 mg Subcutaneous Q24H  . feeding supplement (ENSURE ENLIVE)  237 mL Oral TID BM  . mouth rinse  15 mL Mouth Rinse BID  . methylPREDNISolone (SOLU-MEDROL) injection  30 mg Intravenous Q12H  . vitamin C  500 mg Oral Daily  . zinc sulfate  220 mg Oral Daily   Continuous Infusions: . dextrose 75 mL/hr at 04/14/19 0400   PRN Meds:.  DVT prophylaxis: Lovenox Code Status: DNR Family Communication: d/w wife [name] over the phone, [phone] Disposition Plan: TBD  Consultants:   None   Procedures:   None     Objective: Vitals:  04/13/19 1915 04/13/19 1930 04/14/19 0400 04/14/19 0810  BP: (!) 157/73 (!) 150/63 96/85 (!) 124/57  Pulse: 73 66 (!) 58 65  Resp: 16  16   Temp:  99.8 F (37.7 C) 99.3 F (37.4 C) 99.1 F (37.3 C)  TempSrc:    Axillary  SpO2: 99% 97% 96% 96%  Weight:      Height:        Intake/Output Summary (Last 24 hours) at 04/14/2019 0926 Last data filed at 04/14/2019 0500 Gross per 24 hour  Intake 1513.21 ml  Output 1850 ml  Net -336.79 ml   Filed Weights   05/01/2019 2030  04/03/2019 2033  Weight: 64.4 kg 64.4 kg    Examination:  Constitutional: Lying in bed, does not appear to be in any significant distress Eyes: No scleral icterus ENMT: Dry mucous membranes Neck: No JVD Respiratory: Shallow breathing, diminished at the bases, but overall clear without wheezing or crackles Cardiovascular: Regular rate and rhythm, no murmurs.  No edema Abdomen: Soft, NT, ND, bowel sounds positive Musculoskeletal: no clubbing / cyanosis. Skin: No new rashes Neurologic: Tracks me with his eyes but does not follow any commands and does not talk   Data Reviewed: I have independently reviewed following labs and imaging studies  CBC: Recent Labs  Lab 04/10/19 0500 04/11/19 0152 04/12/19 0335 04/13/19 0341 04/14/19 0420  WBC 6.2 7.7 5.8 6.8 6.7  NEUTROABS 5.6 6.6 4.8 5.5 5.3  HGB 10.6* 10.1* 10.1* 10.1* 10.0*  HCT 34.5* 31.3* 32.3* 32.5* 31.9*  MCV 94.8 92.9 94.4 93.1 91.7  PLT 159 165 207 238 144   Basic Metabolic Panel: Recent Labs  Lab 04/10/19 0500 04/11/19 0152 04/12/19 0335 04/13/19 0341 04/14/19 0420  NA 141 145 148* 148* 142  K 4.1 3.9 4.0 3.7 3.5  CL 109 114* 117* 118* 111  CO2 19* 22 22 22 24   GLUCOSE 164* 175* 151* 163* 165*  BUN 24* 37* 37* 40* 30*  CREATININE 0.74 0.81 0.73 0.78 0.71  CALCIUM 7.8* 8.0* 8.0* 8.0* 7.6*  MG 1.8 2.0 2.1 2.1 2.0  PHOS 2.2* 1.9* 2.6 2.4* 2.3*   GFR: Estimated Creatinine Clearance: 73.8 mL/min (by C-G formula based on SCr of 0.71 mg/dL). Liver Function Tests: Recent Labs  Lab 04/10/19 0500 04/11/19 0152 04/12/19 0335 04/13/19 0341 04/14/19 0420  AST 29 27 66* 38 23  ALT 33 32 85* 83* 68*  ALKPHOS 90 93 93 81 82  BILITOT 0.3 0.3 0.3 0.4 0.3  PROT 6.0* 5.7* 5.9* 5.4* 5.2*  ALBUMIN 2.7* 2.6* 2.7* 2.6* 2.4*   No results for input(s): LIPASE, AMYLASE in the last 168 hours. No results for input(s): AMMONIA in the last 168 hours. Coagulation Profile: Recent Labs  Lab 04/06/2019 1123  INR 1.1   Cardiac  Enzymes: No results for input(s): CKTOTAL, CKMB, CKMBINDEX, TROPONINI in the last 168 hours. BNP (last 3 results) No results for input(s): PROBNP in the last 8760 hours. HbA1C: No results for input(s): HGBA1C in the last 72 hours. CBG: Recent Labs  Lab 04/30/2019 1109  GLUCAP 133*   Lipid Profile: No results for input(s): CHOL, HDL, LDLCALC, TRIG, CHOLHDL, LDLDIRECT in the last 72 hours. Thyroid Function Tests: No results for input(s): TSH, T4TOTAL, FREET4, T3FREE, THYROIDAB in the last 72 hours. Anemia Panel: Recent Labs    04/13/19 0341 04/14/19 0420  FERRITIN 250 209   Urine analysis:    Component Value Date/Time   COLORURINE YELLOW 04/13/2019 1123   APPEARANCEUR HAZY (A) 04/04/2019  1123   LABSPEC 1.026 04/14/2019 1123   PHURINE 5.0 04/08/2019 1123   GLUCOSEU NEGATIVE 04/25/2019 1123   HGBUR NEGATIVE 04/12/2019 [place] 04/26/2019 [place] 04/06/2019 1123   PROTEINUR 100 (A) 04/11/2019 1123   NITRITE NEGATIVE 04/03/2019 1123   LEUKOCYTESUR NEGATIVE 04/16/2019 1123   Sepsis Labs: Invalid input(s): PROCALCITONIN, LACTICIDVEN  Recent Results (from the past 240 hour(s))  Blood Culture (routine x 2)     Status: None (Preliminary result)   Collection Time: 04/08/2019 11:23 AM   Specimen: BLOOD RIGHT FOREARM  Result Value Ref Range Status   Specimen Description BLOOD RIGHT FOREARM  Final   Special Requests   Final    BOTTLES DRAWN AEROBIC AND ANAEROBIC Blood Culture adequate volume   Culture   Final    NO GROWTH 4 DAYS Performed at [facility], [address], [place], [place] [address]    Report Status PENDING  Incomplete  Urine culture     Status: None   Collection Time: 04/21/2019 11:23 AM   Specimen: In/Out Cath Urine  Result Value Ref Range Status   Specimen Description IN/OUT CATH URINE  Final   Special Requests NONE  Final   Culture   Final    NO GROWTH Performed at [facility], [place] [address],  [place], [place] [address]    Report Status 04/10/2019 FINAL  Final  Blood Culture (routine x 2)     Status: None (Preliminary result)   Collection Time: 04/01/2019 11:28 AM   Specimen: BLOOD  Result Value Ref Range Status   Specimen Description BLOOD LEFT ANTECUBITAL  Final   Special Requests   Final    BOTTLES DRAWN AEROBIC AND ANAEROBIC Blood Culture results may not be optimal due to an excessive volume of blood received in culture bottles   Culture   Final    NO GROWTH 4 DAYS Performed at [facility], [place] [address], [place], [place] [address]    Report Status PENDING  Incomplete  SARS Coronavirus 2 (CEPHEID - Performed in [place] hospital lab), Hosp Order     Status: Abnormal   Collection Time: 04/22/2019 12:01 PM   Specimen: Nasopharyngeal Swab  Result Value Ref Range Status   SARS Coronavirus 2 POSITIVE (A) NEGATIVE Final    Comment: RESULT CALLED TO, READ BACK BY AND VERIFIED WITH: Claretta Fraise RN 13:45 04/08/2019 ([email or web address]) (NOTE) If result is NEGATIVE SARS-CoV-2 target nucleic acids are NOT DETECTED. The SARS-CoV-2 RNA is generally detectable in upper and lower  respiratory specimens during the acute phase of infection. The lowest  concentration of SARS-CoV-2 viral copies this assay can detect is 250  copies / mL. A negative result does not preclude SARS-CoV-2 infection  and should not be used as the sole basis for treatment or other  patient management decisions.  A negative result may occur with  improper specimen collection / handling, submission of specimen other  than nasopharyngeal swab, presence of viral mutation(s) within the  areas targeted by this assay, and inadequate number of viral copies  (<250 copies / mL). A negative result must be combined with clinical  observations, patient history, and epidemiological information. If result is POSITIVE SARS-CoV-2 target nucleic acids are DETECTED.  The SARS-CoV-2 RNA is generally detectable in upper and lower   respiratory specimens during the acute phase of infection.  Positive  results are indicative of active infection with SARS-CoV-2.  Clinical  correlation with patient  history and other diagnostic information is  necessary to determine patient infection status.  Positive results do  not rule out bacterial infection or co-infection with other viruses. If result is PRESUMPTIVE POSTIVE SARS-CoV-2 nucleic acids MAY BE PRESENT.   A presumptive positive result was obtained on the submitted specimen  and confirmed on repeat testing.  While 2019 novel coronavirus  (SARS-CoV-2) nucleic acids may be present in the submitted sample  additional confirmatory testing may be necessary for epidemiological  and / or clinical management purposes  to differentiate between  SARS-CoV-2 and other Sarbecovirus currently known to infect humans.  If clinically indicated additional testing with an alternate test  methodology [phone]) i s advised. The SARS-CoV-2 RNA is generally  detectable in upper and lower respiratory specimens during the acute  phase of infection. The expected result is Negative. Fact Sheet for Patients:  StrictlyIdeas.no Fact Sheet for Healthcare Providers: BankingDealers.co.za This test is not yet approved or cleared by the [place] FDA and has been authorized for detection and/or diagnosis of SARS-CoV-2 by FDA under an Emergency Use Authorization (EUA).  This EUA will remain in effect (meaning this test can be used) for the duration of the COVID-19 declaration under Section 564(b)(1) of the Act, 21 U.S.C. section 360bbb-3(b)(1), unless the authorization is terminated or revoked sooner. Performed at [facility], [place] [address], [place], [place] [address]       Radiology Studies: No results found.  Marzetta Board, MD, PhD [facility]  Contact via  [email or web address]  [place] P: [phone] F: [phone]

## 2019-04-14 NOTE — Care Management Important Message (Signed)
Important Message  Patient Details  Name: COSTA JHA MRN: [id number] Date of Birth: [birth year]   Medicare Important Message Given:  Yes - Important Message mailed due to current National Emergency   Verbal consent obtained due to current National Emergency    Contact Name: [facility] Call Date: 04/14/19  Time: 1531 Phone: [phone] Outcome: Spoke with contact Important Message mailed to: Patient address on file     Dilynn Munroe [address] 04/14/2019, 3:31 PM

## 2019-04-15 NOTE — Progress Notes (Signed)
Nutrition Brief Note  Chart reviewed. Pt now comfort care focus only. No diet ordered. No further nutrition interventions warranted at this time.  Please re-consult as needed.   [employer] MS, RDN, LDN, CNSC [phone] Pager  [phone] Weekend/On-Call Pager

## 2019-04-15 NOTE — Progress Notes (Signed)
Wife was able to visit patient in room 172 at 3pm.

## 2019-04-15 NOTE — Progress Notes (Signed)
Manufacturing engineer [facility]) Hospital Liaison note.   Received request from Kingsley Spittle, [place] for family interest in [facility]. Unfortunately [employer] is not able to offer a room today.  Family and CSW are aware [facility] liaison will follow up with CSW and family tomorrow or sooner if room becomes available. Please do not hesitate to call with questions.   Thank you,      Farrel Gordon, RN, [facility]   [place]     [place] are on AMION

## 2019-04-15 NOTE — Progress Notes (Signed)
PROGRESS NOTE    Thomas Bridges [employer]  LXB:[id number] DOB: 12-16-1945 DOA: 04/20/2019 PCP: Josetta Huddle, MD      Brief Narrative:  Mr. Seidman is a 73 y.o. M with advanced dementia and HTN who presented with weakness, fever, found to have SARS-CoV-2 infection, AKI, and suspected bacterial CAP.  Started on empiric ceftriaxone and azithromycin, as well as remdesivir and steroids and admitted.       Assessment & Plan:  Coronavirus pneumonitis with acute hypoxic respiratory failure Acute metabolic encephalopathy due to COVID-19 Failure to thrive  In setting of ongoing 2020 COVID-19 pandemic. He had pneumonia by CXR but no significant respiratory symptoms per report and no O2 requirement at any time. Rather, his underlying dementia was complicated by encephalopathy and failure of PO intake.    He has now been 5 days with no meaningful neurological recovery, and appears to be actively dying.  Palliative Care were consulted, and in discussions between my partner, family and Palliative Care, it was decided to pursue comfort measures.  I discussed discontinuing IV fluids with wife, but she desired that he be preserved until transfer to [facility] was possible -Continue D5 infusion   Dementia See above -Consult Palliative Care  Possible community acquired pneumonia Completed 5 days azithromycin and ceftriaxone.  Hypernatremia -Hold Lasix -Pursue comfort measures, no further blood draws  Hypertension BP normal off meds -Hold Lasix  Acute kidney injury Resolved -Hold Lasix  Anemia of chronic disease Hgb stable throughout hospitalization.        MDM and disposition: The below labs and imaging reports were reviewed and summarized above.  Medication management as above.  The patient was admitted with COVID-19 and acute metabolic encephaloapthy.  He has had no meaningful neurological recovery, and now appears to be actively dying.  We have converted to comfort measures,  and will plan for in hospital death or possibly transition to inpatient hospice.      DVT prophylaxis: N/A, comfort measures Code Status: DNR Family Communication: Wife by phone    Consultants:   Palliative Care  Procedures:   None  Antimicrobials:   Ceftriaxone 7/9 - 7/13  Azithromycin 7/9 - 7/13  Culture data:   7/9 blood culture x2 -- NG  7/9 urine culture -- NG       Subjective: Unable to answer questions.  No fever per nursing.  No respiratory distress.  Objective: Vitals:   04/14/19 2000 04/15/19 0000 04/15/19 0859 04/15/19 1230  BP: 129/60 (!) 116/54  (!) 132/59  Pulse: (!) 59 67    Resp:      Temp: 99 F (37.2 C)  98.9 F (37.2 C) 98.4 F (36.9 C)  TempSrc: Axillary  Oral Oral  SpO2: 97% 95%  94%  Weight:      Height:        Intake/Output Summary (Last 24 hours) at 04/15/2019 1245 Last data filed at 04/15/2019 0000 Gross per 24 hour  Intake 1464.62 ml  Output 900 ml  Net 564.62 ml   Filed Weights   04/12/2019 2030 05/01/2019 2033  Weight: 64.4 kg 64.4 kg    Examination: General appearance: thin elderly frail appearing adult male, obtuned.  HEENT: Anicteric, conjunctiva pallid, lids and lashes atrophic. No nasal deformity, discharge, epistaxis.  Lips dry, edentulous, OP dry, no oral lesions.   Skin: Warm and dry.  no jaundice.  No suspicious rashes or lesions. Cardiac: Tachycardic, regular, nl S1-S2, no murmurs appreciated.  JVP not visible.  No LE edema.  Respiratory: Tachpyneic, shallow, no rales. Abdomen: Abdomen soft.  No grimace to palpation.  No ascites, distension, hepatosplenomegaly.   MSK: Diffuse loss of muscle and fat Neuro: Obtunded, eyes shift to touch, no spontaneous movements, no spontaneous verbalizations Psych: Unable to assess.      Data Reviewed: I have personally reviewed following labs and imaging studies:  CBC: Recent Labs  Lab 04/10/19 0500 04/11/19 0152 04/12/19 0335 04/13/19 0341 04/14/19 0420  WBC  6.2 7.7 5.8 6.8 6.7  NEUTROABS 5.6 6.6 4.8 5.5 5.3  HGB 10.6* 10.1* 10.1* 10.1* 10.0*  HCT 34.5* 31.3* 32.3* 32.5* 31.9*  MCV 94.8 92.9 94.4 93.1 91.7  PLT 159 165 207 238 989   Basic Metabolic Panel: Recent Labs  Lab 04/10/19 0500 04/11/19 0152 04/12/19 0335 04/13/19 0341 04/14/19 0420  NA 141 145 148* 148* 142  K 4.1 3.9 4.0 3.7 3.5  CL 109 114* 117* 118* 111  CO2 19* 22 22 22 24   GLUCOSE 164* 175* 151* 163* 165*  BUN 24* 37* 37* 40* 30*  CREATININE 0.74 0.81 0.73 0.78 0.71  CALCIUM 7.8* 8.0* 8.0* 8.0* 7.6*  MG 1.8 2.0 2.1 2.1 2.0  PHOS 2.2* 1.9* 2.6 2.4* 2.3*   GFR: Estimated Creatinine Clearance: 73.8 mL/min (by C-G formula based on SCr of 0.71 mg/dL). Liver Function Tests: Recent Labs  Lab 04/10/19 0500 04/11/19 0152 04/12/19 0335 04/13/19 0341 04/14/19 0420  AST 29 27 66* 38 23  ALT 33 32 85* 83* 68*  ALKPHOS 90 93 93 81 82  BILITOT 0.3 0.3 0.3 0.4 0.3  PROT 6.0* 5.7* 5.9* 5.4* 5.2*  ALBUMIN 2.7* 2.6* 2.7* 2.6* 2.4*   No results for input(s): LIPASE, AMYLASE in the last 168 hours. No results for input(s): AMMONIA in the last 168 hours. Coagulation Profile: Recent Labs  Lab 04/23/2019 1123  INR 1.1   Cardiac Enzymes: No results for input(s): CKTOTAL, CKMB, CKMBINDEX, TROPONINI in the last 168 hours. BNP (last 3 results) No results for input(s): PROBNP in the last 8760 hours. HbA1C: No results for input(s): HGBA1C in the last 72 hours. CBG: Recent Labs  Lab 04/19/2019 1109  GLUCAP 133*   Lipid Profile: No results for input(s): CHOL, HDL, LDLCALC, TRIG, CHOLHDL, LDLDIRECT in the last 72 hours. Thyroid Function Tests: No results for input(s): TSH, T4TOTAL, FREET4, T3FREE, THYROIDAB in the last 72 hours. Anemia Panel: Recent Labs    04/13/19 0341 04/14/19 0420  FERRITIN 250 209   Urine analysis:    Component Value Date/Time   COLORURINE YELLOW 04/03/2019 1123   APPEARANCEUR HAZY (A) 05/01/2019 1123   LABSPEC 1.026 04/26/2019 1123   PHURINE  5.0 04/25/2019 1123   GLUCOSEU NEGATIVE 04/15/2019 1123   HGBUR NEGATIVE 04/08/2019 1123   [place] 04/12/2019 1123   KETONESUR NEGATIVE 04/30/2019 1123   PROTEINUR 100 (A) 04/15/2019 1123   NITRITE NEGATIVE 04/06/2019 1123   LEUKOCYTESUR NEGATIVE 04/22/2019 1123   Sepsis Labs: @LABRCNTIP (procalcitonin:4,lacticacidven:4)  ) Recent Results (from the past 240 hour(s))  Blood Culture (routine x 2)     Status: None   Collection Time: 04/12/2019 11:23 AM   Specimen: BLOOD RIGHT FOREARM  Result Value Ref [place] Status   Specimen Description BLOOD RIGHT FOREARM  Final   Special Requests   Final    BOTTLES DRAWN AEROBIC AND ANAEROBIC Blood Culture adequate volume   Culture   Final    NO GROWTH 5 DAYS Performed at [facility], [place] [address], [place], [place] [address]  Report Status 04/14/2019 FINAL  Final  Urine culture     Status: None   Collection Time: 05/01/2019 11:23 AM   Specimen: In/Out Cath Urine  Result Value Ref [place] Status   Specimen Description IN/OUT CATH URINE  Final   Special Requests NONE  Final   Culture   Final    NO GROWTH Performed at [facility], [place] [address], [place], [place] [address]    Report Status 04/10/2019 FINAL  Final  Blood Culture (routine x 2)     Status: None   Collection Time: 04/05/2019 11:28 AM   Specimen: BLOOD  Result Value Ref [place] Status   Specimen Description BLOOD LEFT ANTECUBITAL  Final   Special Requests   Final    BOTTLES DRAWN AEROBIC AND ANAEROBIC Blood Culture results may not be optimal due to an excessive volume of blood received in culture bottles   Culture   Final    NO GROWTH 5 DAYS Performed at [facility], [place] [address], [place], [place] [address]    Report Status 04/14/2019 FINAL  Final  SARS Coronavirus 2 (CEPHEID - Performed in [facility] hospital lab), Hosp Order     Status: Abnormal   Collection Time: 04/15/2019 12:01 PM   Specimen: Nasopharyngeal Swab  Result Value Ref [place]  Status   SARS Coronavirus 2 POSITIVE (A) NEGATIVE Final    Comment: RESULT CALLED TO, READ BACK BY AND VERIFIED WITH: Claretta Fraise RN 13:45 04/08/2019 ([email or web address]) (NOTE) If result is NEGATIVE SARS-CoV-2 target nucleic acids are NOT DETECTED. The SARS-CoV-2 RNA is generally detectable in upper and lower  respiratory specimens during the acute phase of infection. The lowest  concentration of SARS-CoV-2 viral copies this assay can detect is 250  copies / mL. A negative result does not preclude SARS-CoV-2 infection  and should not be used as the sole basis for treatment or other  patient management decisions.  A negative result may occur with  improper specimen collection / handling, submission of specimen other  than nasopharyngeal swab, presence of viral mutation(s) within the  areas targeted by this assay, and inadequate number of viral copies  (<250 copies / mL). A negative result must be combined with clinical  observations, patient history, and epidemiological information. If result is POSITIVE SARS-CoV-2 target nucleic acids are DETECTED.  The SARS-CoV-2 RNA is generally detectable in upper and lower  respiratory specimens during the acute phase of infection.  Positive  results are indicative of active infection with SARS-CoV-2.  Clinical  correlation with patient history and other diagnostic information is  necessary to determine patient infection status.  Positive results do  not rule out bacterial infection or co-infection with other viruses. If result is PRESUMPTIVE POSTIVE SARS-CoV-2 nucleic acids MAY BE PRESENT.   A presumptive positive result was obtained on the submitted specimen  and confirmed on repeat testing.  While 2019 novel coronavirus  (SARS-CoV-2) nucleic acids may be present in the submitted sample  additional confirmatory testing may be necessary for epidemiological  and / or clinical management purposes  to differentiate between  SARS-CoV-2 and other Sarbecovirus  currently known to infect humans.  If clinically indicated additional testing with an alternate test  methodology [phone]) i s advised. The SARS-CoV-2 RNA is generally  detectable in upper and lower respiratory specimens during the acute  phase of infection. The expected result is Negative. Fact Sheet for Patients:  StrictlyIdeas.no Fact Sheet for Healthcare Providers: BankingDealers.co.za This test is not yet approved  or cleared by the [place] and has been authorized for detection and/or diagnosis of SARS-CoV-2 by FDA under an Emergency Use Authorization (EUA).  This EUA will remain in effect (meaning this test can be used) for the duration of the COVID-19 declaration under Section 564(b)(1) of the Act, 21 U.S.C. section 360bbb-3(b)(1), unless the authorization is terminated or revoked sooner. Performed at [facility], [place] [address] [place], [place] [address]          Radiology Studies: No results found.      Scheduled Meds: . donepezil  10 mg Oral QHS  . mouth rinse  15 mL Mouth Rinse BID  . methylPREDNISolone (SOLU-MEDROL) injection  30 mg Intravenous Q12H   Continuous Infusions: . dextrose 75 mL/hr at 04/15/19 0404     LOS: 6 days    Time spent: 25 minutes      Edwin Dada, MD [facility] 04/15/2019, 12:45 PM     Please page through [place]:  [email or web address] Password [email or web address] If 7PM-7AM, please contact night-coverage

## 2019-04-15 NOTE — Progress Notes (Signed)
CSW followed up with [facility] on bed avail, they report still no bed avail today.   [place], [employer]

## 2019-04-15 NOTE — Progress Notes (Signed)
Called and updated patient's wife, [name].  All questions answered.  Earleen Reaper RN

## 2019-04-15 NOTE — Plan of Care (Signed)
  Problem: Education: Goal: Knowledge of risk factors and measures for prevention of condition will improve Outcome: Adequate for Discharge   Problem: Respiratory: Goal: Will maintain a patent airway Outcome: Adequate for Discharge   Problem: Clinical Measurements: Goal: Ability to maintain clinical measurements within normal limits will improve Outcome: Adequate for Discharge Goal: Diagnostic test results will improve Outcome: Adequate for Discharge   Problem: Nutrition: Goal: Adequate nutrition will be maintained Outcome: Adequate for Discharge

## 2019-04-16 NOTE — Progress Notes (Signed)
PROGRESS NOTE    Thomas Bridges [facility]  QKM:[id number] DOB: [birth year] DOA: 04/24/2019 PCP: Josetta Huddle, MD      Brief Narrative:  Mr. Thomas Bridges is a 73 y.o. M with advanced dementia and HTN who presented with weakness, fever, found to have SARS-CoV-2 infection, AKI, and suspected bacterial CAP.  Started on empiric ceftriaxone and azithromycin, as well as remdesivir and steroids and admitted.       Assessment & Plan:  Acute metabolic encephalopathy due to COVID-19 Coronavirus pneumonitis with acute hypoxic respiratory failure Failure to thrive Minimal respiratory failure, not requiring supplemental O2 at present. See note from 7/15 -Continue D5 infusion for now -Comfort measures   Dementia  Possible community acquired pneumonia Completed 5 days azithromycin and ceftriaxone.  Hypernatremia -Pursue comfort measures, no further blood draws  Hypertension BP normal yesterday, comfort measures now, no further BP checks necessary.  Acute kidney injury  Anemia of chronic disease No reports of bleeding.       MDM and disposition: The below labs and imaging reports were reviewed and summarized above.  Medication management as above.  The patient was admitted with COVID-19 and acute metabolic encephalopathy.  He still has had no meaningful neurological comfortably, appears to be actively dying, and discussion with palliative care and family, he has been converted to comfort measures.  We will continue to make him comfortable, and plan for in-hospital death.  Reasonable to transition to residential hospice if bed becomes available.     DVT prophylaxis: N/A, comfort measures Code Status: DNR Family Communication: Wife by phone    Consultants:   Palliative Care  Procedures:   None  Antimicrobials:   Ceftriaxone 7/9 - 7/13  Azithromycin 7/9 - 7/13  Culture data:   7/9 blood culture x2 -- NG  7/9 urine culture -- NG       Subjective: Unable to answer  questions.  Appears to be grunting with respirations.  Not requiring much morphine.  Objective: Vitals:   04/16/19 0300 04/16/19 0400 04/16/19 0500 04/16/19 0600  BP:      Pulse: 72 76 76 73  Resp:      Temp:      TempSrc:      SpO2: 100% 98% 99% 96%  Weight:      Height:        Intake/Output Summary (Last 24 hours) at 04/16/2019 1406 Last data filed at 04/16/2019 0600 Gross per 24 hour  Intake 893.42 ml  Output 1025 ml  Net -131.58 ml   Filed Weights   04/13/2019 2030 04/23/2019 2033  Weight: 64.4 kg 64.4 kg    Examination: General appearance: Frail elderly male, not responsive to stimuli HEENT: Anicteric, conjunctiva pallid, lids and lashes atrophic. No nasal deformity, discharge, epistaxis.  Lips dry, edentulous, OP dry, no oral lesions.   Skin: Dry, tented Cardiac: Tachycardic, no murmurs.    Respiratory: Tachypneic, grunting, shallow respirations, no rales Abdomen: Abdomen soft, scaphoid.  No grimace to palpation.   MSK: Diffuse loss of muscle and fat Neuro: Obtunded, eyes shift to touch, no spontaneous movements, no spontaneous verbalizations Psych: Unable to assess.      Data Reviewed: I have personally reviewed following labs and imaging studies:  CBC: Recent Labs  Lab 04/10/19 0500 04/11/19 0152 04/12/19 0335 04/13/19 0341 04/14/19 0420  WBC 6.2 7.7 5.8 6.8 6.7  NEUTROABS 5.6 6.6 4.8 5.5 5.3  HGB 10.6* 10.1* 10.1* 10.1* 10.0*  HCT 34.5* 31.3* 32.3* 32.5* 31.9*  MCV 94.8 92.9 94.4  93.1 91.7  PLT 159 165 207 238 128   Basic Metabolic Panel: Recent Labs  Lab 04/10/19 0500 04/11/19 0152 04/12/19 0335 04/13/19 0341 04/14/19 0420  NA 141 145 148* 148* 142  K 4.1 3.9 4.0 3.7 3.5  CL 109 114* 117* 118* 111  CO2 19* 22 22 22 24   GLUCOSE 164* 175* 151* 163* 165*  BUN 24* 37* 37* 40* 30*  CREATININE 0.74 0.81 0.73 0.78 0.71  CALCIUM 7.8* 8.0* 8.0* 8.0* 7.6*  MG 1.8 2.0 2.1 2.1 2.0  PHOS 2.2* 1.9* 2.6 2.4* 2.3*   GFR: Estimated Creatinine  Clearance: 73.8 mL/min (by C-G formula based on SCr of 0.71 mg/dL). Liver Function Tests: Recent Labs  Lab 04/10/19 0500 04/11/19 0152 04/12/19 0335 04/13/19 0341 04/14/19 0420  AST 29 27 66* 38 23  ALT 33 32 85* 83* 68*  ALKPHOS 90 93 93 81 82  BILITOT 0.3 0.3 0.3 0.4 0.3  PROT 6.0* 5.7* 5.9* 5.4* 5.2*  ALBUMIN 2.7* 2.6* 2.7* 2.6* 2.4*   No results for input(s): LIPASE, AMYLASE in the last 168 hours. No results for input(s): AMMONIA in the last 168 hours. Coagulation Profile: No results for input(s): INR, PROTIME in the last 168 hours. Cardiac Enzymes: No results for input(s): CKTOTAL, CKMB, CKMBINDEX, TROPONINI in the last 168 hours. BNP (last 3 results) No results for input(s): PROBNP in the last 8760 hours. HbA1C: No results for input(s): HGBA1C in the last 72 hours. CBG: No results for input(s): GLUCAP in the last 168 hours. Lipid Profile: No results for input(s): CHOL, HDL, LDLCALC, TRIG, CHOLHDL, LDLDIRECT in the last 72 hours. Thyroid Function Tests: No results for input(s): TSH, T4TOTAL, FREET4, T3FREE, THYROIDAB in the last 72 hours. Anemia Panel: Recent Labs    04/14/19 0420  FERRITIN 209   Urine analysis:    Component Value Date/Time   COLORURINE YELLOW 04/13/2019 1123   APPEARANCEUR HAZY (A) 04/02/2019 1123   LABSPEC 1.026 04/02/2019 1123   PHURINE 5.0 04/22/2019 1123   GLUCOSEU NEGATIVE 04/23/2019 1123   HGBUR NEGATIVE 04/03/2019 [place] 04/17/2019 1123   KETONESUR NEGATIVE 04/21/2019 1123   PROTEINUR 100 (A) 04/28/2019 1123   NITRITE NEGATIVE 04/14/2019 1123   LEUKOCYTESUR NEGATIVE 04/05/2019 1123   Sepsis Labs: @LABRCNTIP (procalcitonin:4,lacticacidven:4)  ) Recent Results (from the past 240 hour(s))  Blood Culture (routine x 2)     Status: None   Collection Time: 04/29/2019 11:23 AM   Specimen: BLOOD RIGHT FOREARM  Result Value Ref [place] Status   Specimen Description BLOOD RIGHT FOREARM  Final   Special Requests   Final     BOTTLES DRAWN AEROBIC AND ANAEROBIC Blood Culture adequate volume   Culture   Final    NO GROWTH 5 DAYS Performed at [facility], [place] [address], [place], [place] [address]    Report Status 04/14/2019 FINAL  Final  Urine culture     Status: None   Collection Time: 04/08/2019 11:23 AM   Specimen: In/Out Cath Urine  Result Value Ref [place] Status   Specimen Description IN/OUT CATH URINE  Final   Special Requests NONE  Final   Culture   Final    NO GROWTH Performed at [facility], [place] [address], [place], [place] [address]    Report Status 04/10/2019 FINAL  Final  Blood Culture (routine x 2)     Status: None   Collection Time: 04/17/2019 11:28 AM   Specimen: BLOOD  Result Value Ref [place]  Status   Specimen Description BLOOD LEFT ANTECUBITAL  Final   Special Requests   Final    BOTTLES DRAWN AEROBIC AND ANAEROBIC Blood Culture results may not be optimal due to an excessive volume of blood received in culture bottles   Culture   Final    NO GROWTH 5 DAYS Performed at [facility], [place] [address], [place], [place] [address]    Report Status 04/14/2019 FINAL  Final  SARS Coronavirus 2 (CEPHEID - Performed in [place] hospital lab), Hosp Order     Status: Abnormal   Collection Time: 04/01/2019 12:01 PM   Specimen: Nasopharyngeal Swab  Result Value Ref [place] Status   SARS Coronavirus 2 POSITIVE (A) NEGATIVE Final    Comment: RESULT CALLED TO, READ BACK BY AND VERIFIED WITH: Claretta Fraise RN 13:45 04/17/2019 ([email or web address]) (NOTE) If result is NEGATIVE SARS-CoV-2 target nucleic acids are NOT DETECTED. The SARS-CoV-2 RNA is generally detectable in upper and lower  respiratory specimens during the acute phase of infection. The lowest  concentration of SARS-CoV-2 viral copies this assay can detect is 250  copies / mL. A negative result does not preclude SARS-CoV-2 infection  and should not be used as the sole basis for treatment or other  patient management decisions.  A negative  result may occur with  improper specimen collection / handling, submission of specimen other  than nasopharyngeal swab, presence of viral mutation(s) within the  areas targeted by this assay, and inadequate number of viral copies  (<250 copies / mL). A negative result must be combined with clinical  observations, patient history, and epidemiological information. If result is POSITIVE SARS-CoV-2 target nucleic acids are DETECTED.  The SARS-CoV-2 RNA is generally detectable in upper and lower  respiratory specimens during the acute phase of infection.  Positive  results are indicative of active infection with SARS-CoV-2.  Clinical  correlation with patient history and other diagnostic information is  necessary to determine patient infection status.  Positive results do  not rule out bacterial infection or co-infection with other viruses. If result is PRESUMPTIVE POSTIVE SARS-CoV-2 nucleic acids MAY BE PRESENT.   A presumptive positive result was obtained on the submitted specimen  and confirmed on repeat testing.  While 2019 novel coronavirus  (SARS-CoV-2) nucleic acids may be present in the submitted sample  additional confirmatory testing may be necessary for epidemiological  and / or clinical management purposes  to differentiate between  SARS-CoV-2 and other Sarbecovirus currently known to infect humans.  If clinically indicated additional testing with an alternate test  methodology [phone]) i s advised. The SARS-CoV-2 RNA is generally  detectable in upper and lower respiratory specimens during the acute  phase of infection. The expected result is Negative. Fact Sheet for Patients:  StrictlyIdeas.no Fact Sheet for Healthcare Providers: BankingDealers.co.za This test is not yet approved or cleared by the [place] FDA and has been authorized for detection and/or diagnosis of SARS-CoV-2 by FDA under an Emergency Use Authorization  (EUA).  This EUA will remain in effect (meaning this test can be used) for the duration of the COVID-19 declaration under Section 564(b)(1) of the Act, 21 U.S.C. section 360bbb-3(b)(1), unless the authorization is terminated or revoked sooner. Performed at [facility], [place] [address] [place], [place] [address]          Radiology Studies: No results found.      Scheduled Meds: . donepezil  10 mg Oral QHS  . mouth rinse  15 mL  Mouth Rinse BID  . methylPREDNISolone (SOLU-MEDROL) injection  30 mg Intravenous Q12H   Continuous Infusions: . dextrose 75 mL/hr at 04/16/19 0138     LOS: 7 days    Time spent: 15 minutes      Edwin Dada, MD [facility] 04/16/2019, 2:06 PM     Please page through [place]:  [email or web address] Password [email or web address] If 7PM-7AM, please contact night-coverage

## 2019-04-16 NOTE — Progress Notes (Signed)
Patient located in [place] and positive for [place].  Reviewed chart including med history and vital signs as well as notes.   Spoke with bedside RN.    Patient appears comfortable per RN.  Vital signs indicate same.  He has received only one dose of morphine PRN.  Seems comfortable.  Waiting on a bed at [facility].    PMT will remain available to patient, RN, TRH MD as needed and will continue to chart check.  Florentina Jenny, PA-C Palliative Medicine Pager: [phone]  No charge note.

## 2019-04-16 NOTE — Progress Notes (Signed)
CSW reached out to patient's spouse [name] to see if she would be open to considering other residential hospice options as [place] [facility] continues to not have a bed avail for patient as he is COVID + . She reports she does not want to look at other options at this time.   [place], [place]

## 2019-04-17 MED ORDER — CHLORHEXIDINE GLUCONATE 0.12 % MT SOLN
15.0000 mL | Freq: Two times a day (BID) | OROMUCOSAL | Status: DC
  Administered 2019-04-17 – 2019-04-27 (×19): 15 mL via OROMUCOSAL
  Filled 2019-04-17 (×9): qty 15

## 2019-04-17 MED ORDER — METHYLPREDNISOLONE SODIUM SUCC 40 MG IJ SOLR
30.0000 mg | Freq: Every day | INTRAMUSCULAR | Status: DC
  Administered 2019-04-18: 30 mg via INTRAVENOUS
  Filled 2019-04-17: qty 1

## 2019-04-17 MED ORDER — ORAL CARE MOUTH RINSE: 15.0000 mL | Freq: Two times a day (BID) | OROMUCOSAL | Status: DC

## 2019-04-17 NOTE — Progress Notes (Signed)
Turned pt q2h and spoke with spouse this morning.

## 2019-04-17 NOTE — Progress Notes (Signed)
PROGRESS NOTE    Thomas Bridges  EVO:[id number] DOB: [birth year] DOA: 04/14/2019 PCP: Josetta Huddle, MD      Brief Narrative:  Thomas Bridges is a 73 y.o. M with advanced dementia and HTN who presented with weakness, fever, found to have SARS-CoV-2 infection, AKI, and suspected bacterial CAP.  Started on empiric ceftriaxone and azithromycin, as well as remdesivir and steroids and admitted.       Assessment & Plan:  Acute metabolic encephalopathy due to COVID-19 Coronavirus pneumonitis with acute hypoxic respiratory failure Failure to thrive Minimal respiratory failure from COVID, not requiring supplemental O2 at present. Has had marked decline in mentation, now appears to be actively dying.  Transitioned to comfort care, see note from 7/15.  -Continue D5, have discussed with family -Comfort measures -PRN morphine for dyspnea or pain -Reduce steroids -Haldol for agitation   Dementia  Possible community acquired pneumonia Completed 5 days azithromycin and ceftriaxone.  Hypernatremia -Full comfort measures  Hypertension BP normal yesterday, comfort measures now, no further BP checks necessary.  Acute kidney injury  Anemia of chronic disease No reports of bleeding.       MDM and disposition: The patient was admitted with COVID-19 and acute metabolic encephalopathy.  He still has had no meaningful neurological comfortably, appears to be actively dying, and discussion with palliative care and family, he has been converted to comfort measures.  We will continue to make him comfortable, and plan for in-hospital death.  Reasonable to transition to residential hospice if bed becomes available.     DVT prophylaxis: N/A, comfort measures Code Status: DNR Family Communication: Wife by phone    Consultants:   Palliative Care  Procedures:   None  Antimicrobials:   Ceftriaxone 7/9 - 7/13  Azithromycin 7/9 - 7/13  Culture data:   7/9 blood culture x2 -- NG   7/9 urine culture -- NG       Subjective: Unable to answer questions.   No fever overnight.  No respiratory distress. Only required morphine once yesterday for dyspnea.         Objective: Vitals:   04/16/19 1200 04/16/19 1600 04/16/19 2023 04/17/19 0400  BP:  (!) 93/58 (!) 95/54 107/60  Pulse: 76 70    Resp:   14 13  Temp:  100 F (37.8 C) 98.9 F (37.2 C) 98.6 F (37 C)  TempSrc:  Axillary Axillary Axillary  SpO2: 93% 95%    Weight:      Height:        Intake/Output Summary (Last 24 hours) at 04/17/2019 0912 Last data filed at 04/17/2019 0400 Gross per 24 hour  Intake -  Output 450 ml  Net -450 ml   Filed Weights   04/07/2019 2030 04/12/2019 2033  Weight: 64.4 kg 64.4 kg    Examination: General appearance: Frail elderly male, lying in bed, staring at ceiling, no response to stimuli HEENT: sclera, conjunctiva normal.  OP dry.  Edentulous, lips dry.   Skin: Dry, tented, no rashes Cardiac: Tachycardic, no murmurs, no edema in arms, legs Respiratory: Respirations shallow, no grunting.  Diminished breath sounds, no wheezing. Abdomen: Abdomen soft, scaphoid, without guarding, no grimace to palpation. MSK: Diffuse loss of muscle and fat Neuro: Obtunded, eyes shift to touch, no spontaneous movements, no spontaneous verbalizations Psych: Unable to assess.      Data Reviewed: I have personally reviewed following labs and imaging studies:  CBC: Recent Labs  Lab 04/11/19 0152 04/12/19 0335 04/13/19 0341 04/14/19 0420  WBC 7.7 5.8 6.8 6.7  NEUTROABS 6.6 4.8 5.5 5.3  HGB 10.1* 10.1* 10.1* 10.0*  HCT 31.3* 32.3* 32.5* 31.9*  MCV 92.9 94.4 93.1 91.7  PLT 165 207 238 425   Basic Metabolic Panel: Recent Labs  Lab 04/11/19 0152 04/12/19 0335 04/13/19 0341 04/14/19 0420  NA 145 148* 148* 142  K 3.9 4.0 3.7 3.5  CL 114* 117* 118* 111  CO2 22 22 22 24   GLUCOSE 175* 151* 163* 165*  BUN 37* 37* 40* 30*  CREATININE 0.81 0.73 0.78 0.71  CALCIUM 8.0* 8.0*  8.0* 7.6*  MG 2.0 2.1 2.1 2.0  PHOS 1.9* 2.6 2.4* 2.3*   GFR: Estimated Creatinine Clearance: 73.8 mL/min (by C-G formula based on SCr of 0.71 mg/dL). Liver Function Tests: Recent Labs  Lab 04/11/19 0152 04/12/19 0335 04/13/19 0341 04/14/19 0420  AST 27 66* 38 23  ALT 32 85* 83* 68*  ALKPHOS 93 93 81 82  BILITOT 0.3 0.3 0.4 0.3  PROT 5.7* 5.9* 5.4* 5.2*  ALBUMIN 2.6* 2.7* 2.6* 2.4*   No results for input(s): LIPASE, AMYLASE in the last 168 hours. No results for input(s): AMMONIA in the last 168 hours. Coagulation Profile: No results for input(s): INR, PROTIME in the last 168 hours. Cardiac Enzymes: No results for input(s): CKTOTAL, CKMB, CKMBINDEX, TROPONINI in the last 168 hours. BNP (last 3 results) No results for input(s): PROBNP in the last 8760 hours. HbA1C: No results for input(s): HGBA1C in the last 72 hours. CBG: No results for input(s): GLUCAP in the last 168 hours. Lipid Profile: No results for input(s): CHOL, HDL, LDLCALC, TRIG, CHOLHDL, LDLDIRECT in the last 72 hours. Thyroid Function Tests: No results for input(s): TSH, T4TOTAL, FREET4, T3FREE, THYROIDAB in the last 72 hours. Anemia Panel: No results for input(s): VITAMINB12, FOLATE, FERRITIN, TIBC, IRON, RETICCTPCT in the last 72 hours. Urine analysis:    Component Value Date/Time   COLORURINE YELLOW 04/25/2019 1123   APPEARANCEUR HAZY (A) 04/16/2019 1123   LABSPEC 1.026 04/08/2019 1123   PHURINE 5.0 04/28/2019 1123   GLUCOSEU NEGATIVE 04/08/2019 1123   HGBUR NEGATIVE 04/23/2019 1123   BILIRUBINUR NEGATIVE 04/13/2019 1123   KETONESUR NEGATIVE 04/01/2019 1123   PROTEINUR 100 (A) 04/08/2019 1123   NITRITE NEGATIVE 04/08/2019 1123   LEUKOCYTESUR NEGATIVE 04/19/2019 1123   Sepsis Labs: @LABRCNTIP (procalcitonin:4,lacticacidven:4)  ) Recent Results (from the past 240 hour(s))  Blood Culture (routine x 2)     Status: None   Collection Time: 04/14/2019 11:23 AM   Specimen: BLOOD RIGHT FOREARM  Result  Value Ref Range Status   Specimen Description BLOOD RIGHT FOREARM  Final   Special Requests   Final    BOTTLES DRAWN AEROBIC AND ANAEROBIC Blood Culture adequate volume   Culture   Final    NO GROWTH 5 DAYS Performed at [facility], [place] [address] [place], [place] [address]    Report Status 04/14/2019 FINAL  Final  Urine culture     Status: None   Collection Time: 04/05/2019 11:23 AM   Specimen: In/Out Cath Urine  Result Value Ref Range Status   Specimen Description IN/OUT CATH URINE  Final   Special Requests NONE  Final   Culture   Final    NO GROWTH Performed at [facility], [place] [address] [place], [place] [address]    Report Status 04/10/2019 FINAL  Final  Blood Culture (routine x 2)     Status: None   Collection Time: 04/18/2019 11:28 AM  Specimen: BLOOD  Result Value Ref Range Status   Specimen Description BLOOD LEFT ANTECUBITAL  Final   Special Requests   Final    BOTTLES DRAWN AEROBIC AND ANAEROBIC Blood Culture results may not be optimal due to an excessive volume of blood received in culture bottles   Culture   Final    NO GROWTH 5 DAYS Performed at [place] [address] [place], [place] [address]    Report Status 04/14/2019 FINAL  Final  SARS Coronavirus 2 (CEPHEID - Performed in [place] hospital lab), Hosp Order     Status: Abnormal   Collection Time: 04/22/2019 12:01 PM   Specimen: Nasopharyngeal Swab  Result Value Ref Range Status   SARS Coronavirus 2 POSITIVE (A) NEGATIVE Final    Comment: RESULT CALLED TO, READ BACK BY AND VERIFIED WITH: Claretta Fraise RN 13:45 04/08/2019 ([email or web address]) (NOTE) If result is NEGATIVE SARS-CoV-2 target nucleic acids are NOT DETECTED. The SARS-CoV-2 RNA is generally detectable in upper and lower  respiratory specimens during the acute phase of infection. The lowest  concentration of SARS-CoV-2 viral copies this assay can detect is 250  copies / mL. A negative result does not preclude SARS-CoV-2 infection  and  should not be used as the sole basis for treatment or other  patient management decisions.  A negative result may occur with  improper specimen collection / handling, submission of specimen other  than nasopharyngeal swab, presence of viral mutation(s) within the  areas targeted by this assay, and inadequate number of viral copies  (<250 copies / mL). A negative result must be combined with clinical  observations, patient history, and epidemiological information. If result is POSITIVE SARS-CoV-2 target nucleic acids are DETECTED.  The SARS-CoV-2 RNA is generally detectable in upper and lower  respiratory specimens during the acute phase of infection.  Positive  results are indicative of active infection with SARS-CoV-2.  Clinical  correlation with patient history and other diagnostic information is  necessary to determine patient infection status.  Positive results do  not rule out bacterial infection or co-infection with other viruses. If result is PRESUMPTIVE POSTIVE SARS-CoV-2 nucleic acids MAY BE PRESENT.   A presumptive positive result was obtained on the submitted specimen  and confirmed on repeat testing.  While 2019 novel coronavirus  (SARS-CoV-2) nucleic acids may be present in the submitted sample  additional confirmatory testing may be necessary for epidemiological  and / or clinical management purposes  to differentiate between  SARS-CoV-2 and other Sarbecovirus currently known to infect humans.  If clinically indicated additional testing with an alternate test  methodology [phone]) i s advised. The SARS-CoV-2 RNA is generally  detectable in upper and lower respiratory specimens during the acute  phase of infection. The expected result is Negative. Fact Sheet for Patients:  StrictlyIdeas.no Fact Sheet for Healthcare Providers: BankingDealers.co.za This test is not yet approved or cleared by the [place] FDA and has been  authorized for detection and/or diagnosis of SARS-CoV-2 by FDA under an Emergency Use Authorization (EUA).  This EUA will remain in effect (meaning this test can be used) for the duration of the COVID-19 declaration under Section 564(b)(1) of the Act, 21 U.S.C. section 360bbb-3(b)(1), unless the authorization is terminated or revoked sooner. Performed at [facility], [place] [address], [place], [place] [address]          Radiology Studies: No results found.      Scheduled Meds: . mouth rinse  15 mL Mouth  Rinse BID  . [START ON 04/18/2019] methylPREDNISolone (SOLU-MEDROL) injection  30 mg Intravenous Daily   Continuous Infusions: . dextrose 75 mL/hr at 04/16/19 1649     LOS: 8 days    Time spent: 25 minutes      Edwin Dada, MD [facility] 04/17/2019, 9:12 AM     Please page through [place]:  [email or web address] Password [email or web address] If 7PM-7AM, please contact night-coverage

## 2019-04-17 NOTE — Progress Notes (Addendum)
Repeat temp 98.8 axl. Pt arms crossed, tensed, and clutching each other. Facial grimacing and eyes wide open. Does not respond to verbal stimuli but withdrew from pain. Respiration even but fast as 22-24 breaths per minute. No change in lung sounds. Body warm and dry. Provided Incontience and mouth care. Turned and repositioned. Medicated for pain. Bed in lowest position. Call bell in reach. Fall alarms activated. Will continue to monitor.

## 2019-04-17 NOTE — Progress Notes (Signed)
Manufacturing engineer [facility]) [facility]  [facility] is unable to provide a bed for Thomas Bridges at [facility] today.  Should something change, will update the family and [facility].  Thank you, Venia Carbon RN, BSN, [facility] Liaison  [phone]

## 2019-04-17 NOTE — Progress Notes (Signed)
Pt skin warm and sweaty. Axl temp 99.9. No others signs of distress observed. Sponge bath and linen change provided. Cool cloth to neck. Turned and repositioned. Head of bed elevated to 35-40 degrees. Oral care provided. Small roll to back of neck d/t hyperextention of neck. Verbal reassurance given to pt who does not respond to verbal stimuli at this time. Bony prominences padded. Foam applied to sacrum and bilateral heels for comfort. Limbs elevated. Suppository given for temp. Bed in lowest position. Call bell in reach. Reduced lights and TV on low music for comfort. Updated wife via telephone. Will continue to monitor.

## 2019-04-17 NOTE — Plan of Care (Signed)
  Problem: Education: Goal: Knowledge of the prescribed therapeutic regimen will improve Outcome: Progressing   Problem: Coping: Goal: Ability to identify and develop effective coping behavior will improve Outcome: Progressing   

## 2019-04-18 NOTE — Progress Notes (Signed)
Attempted to face time family with pt. Pt was very sleepy and did not arouse during chat. Wife was updated and told Doctor would call her tomorrow. She is in agreement with plan of care and has no other questions at this time.

## 2019-04-18 NOTE — Progress Notes (Addendum)
PROGRESS NOTE    Thomas Bridges [facility]  IOM:[id number] DOB: 03-12-1946 DOA: 04/25/2019 PCP: Josetta Huddle, MD      Brief Narrative:  Mr. Trulson is a 73 y.o. M with advanced dementia and HTN who presented with weakness, fever, found to have SARS-CoV-2 infection, AKI, and suspected bacterial CAP.  Started on empiric ceftriaxone and azithromycin, as well as remdesivir and steroids and admitted.       Assessment & Plan:  Acute metabolic encephalopathy due to COVID-19 Coronavirus pneumonitis with acute hypoxic respiratory failure Failure to thrive Minimal respiratory failure from COVID, not requiring supplemental O2 at present. Has had marked decline in mentation, now appears to be actively dying.  Transitioned to comfort care, see note from 7/15.  -Comfort measures -q2 turns -q2 oral care - PRN morphine for pain or dyspnea    Dementia  Possible community acquired pneumonia Completed 5 days azithromycin and ceftriaxone.  Hypernatremia -Full comfort measures  Hypertension BP normal yesterday, comfort measures now, no further BP checks necessary.  Acute kidney injury  Anemia of chronic disease No reports of bleeding.       MDM and disposition: The patient was admitted with COVID-19 and acute metabolic encephalopathy.  He still has had no meaningful neurological comfortably, appears to be actively dying, and discussion with palliative care and family, he has been converted to comfort measures.  We continue to make him comfortable, keeping clean and dry.  Plan for hospital death.  Reasonable to transition to residential hospice if bed becomes available.     DVT prophylaxis: N/A, comfort measures Code Status: DNR Family Communication:      Consultants:   Palliative Care  Procedures:   None  Antimicrobials:   Ceftriaxone 7/9 - 7/13  Azithromycin 7/9 - 7/13  Culture data:   7/9 blood culture x2 -- NG  7/9 urine culture -- NG       Subjective:  Unable to answer questions.  Minimal response to stimuli.  No fever.      Objective: Vitals:   04/17/19 1630 04/18/19 0429 04/18/19 0800 04/18/19 0922  BP:  (!) 145/91 140/73   Pulse:   84   Resp:      Temp: 99.9 F (37.7 C) 97.6 F (36.4 C) 98.4 F (36.9 C)   TempSrc: Axillary Axillary Oral   SpO2:   98% 98%  Weight:      Height:        Intake/Output Summary (Last 24 hours) at 04/18/2019 1339 Last data filed at 04/18/2019 0400 Gross per 24 hour  Intake 0 ml  Output 1125 ml  Net -1125 ml   Filed Weights   04/18/2019 2030 04/15/2019 2033  Weight: 64.4 kg 64.4 kg    Examination: General appearance: Frail elderly male, lying in bed, staring at ceiling, no response to stimuli.   HEENT: sclera, conjunctiva normal.  OP dry.  Edentulous, lips dry.   Skin: Dry, tented, no rashes Cardiac: Heart rate regular, rhythm normal, no murmurs, no lower extremity edema. Respiratory: Respirations slow, shallow, diminished, no wheezing. Abdomen: Abdomen soft, scaphoid, no guarding, no grimace. MSK: Diffuse loss of muscle and fat Neuro: Staring, no response to stimuli, blinks to touch, no spontaneous verbalizations. Psych: Unable to assess.      Data Reviewed: I have personally reviewed following labs and imaging studies:  CBC: Recent Labs  Lab 04/12/19 0335 04/13/19 0341 04/14/19 0420  WBC 5.8 6.8 6.7  NEUTROABS 4.8 5.5 5.3  HGB 10.1* 10.1* 10.0*  HCT  32.3* 32.5* 31.9*  MCV 94.4 93.1 91.7  PLT 207 238 277   Basic Metabolic Panel: Recent Labs  Lab 04/12/19 0335 04/13/19 0341 04/14/19 0420  NA 148* 148* 142  K 4.0 3.7 3.5  CL 117* 118* 111  CO2 22 22 24   GLUCOSE 151* 163* 165*  BUN 37* 40* 30*  CREATININE 0.73 0.78 0.71  CALCIUM 8.0* 8.0* 7.6*  MG 2.1 2.1 2.0  PHOS 2.6 2.4* 2.3*   GFR: Estimated Creatinine Clearance: 73.8 mL/min (by C-G formula based on SCr of 0.71 mg/dL). Liver Function Tests: Recent Labs  Lab 04/12/19 0335 04/13/19 0341 04/14/19 0420   AST 66* 38 23  ALT 85* 83* 68*  ALKPHOS 93 81 82  BILITOT 0.3 0.4 0.3  PROT 5.9* 5.4* 5.2*  ALBUMIN 2.7* 2.6* 2.4*   No results for input(s): LIPASE, AMYLASE in the last 168 hours. No results for input(s): AMMONIA in the last 168 hours. Coagulation Profile: No results for input(s): INR, PROTIME in the last 168 hours. Cardiac Enzymes: No results for input(s): CKTOTAL, CKMB, CKMBINDEX, TROPONINI in the last 168 hours. BNP (last 3 results) No results for input(s): PROBNP in the last 8760 hours. HbA1C: No results for input(s): HGBA1C in the last 72 hours. CBG: No results for input(s): GLUCAP in the last 168 hours. Lipid Profile: No results for input(s): CHOL, HDL, LDLCALC, TRIG, CHOLHDL, LDLDIRECT in the last 72 hours. Thyroid Function Tests: No results for input(s): TSH, T4TOTAL, FREET4, T3FREE, THYROIDAB in the last 72 hours. Anemia Panel: No results for input(s): VITAMINB12, FOLATE, FERRITIN, TIBC, IRON, RETICCTPCT in the last 72 hours. Urine analysis:    Component Value Date/Time   COLORURINE YELLOW 04/22/2019 1123   APPEARANCEUR HAZY (A) 04/24/2019 1123   LABSPEC 1.026 04/13/2019 1123   PHURINE 5.0 04/08/2019 1123   GLUCOSEU NEGATIVE 04/08/2019 1123   HGBUR NEGATIVE 04/24/2019 1123   [place] 04/26/2019 1123   KETONESUR NEGATIVE 04/15/2019 1123   PROTEINUR 100 (A) 04/06/2019 1123   NITRITE NEGATIVE 04/02/2019 1123   LEUKOCYTESUR NEGATIVE 04/26/2019 1123   Sepsis Labs: @LABRCNTIP (procalcitonin:4,lacticacidven:4)  ) Recent Results (from the past 240 hour(s))  Blood Culture (routine x 2)     Status: None   Collection Time: 04/01/2019 11:23 AM   Specimen: BLOOD RIGHT FOREARM  Result Value Ref Range Status   Specimen Description BLOOD RIGHT FOREARM  Final   Special Requests   Final    BOTTLES DRAWN AEROBIC AND ANAEROBIC Blood Culture adequate volume   Culture   Final    NO GROWTH 5 DAYS Performed at [facility], [place] [address], [place], [place]  [address]    Report Status 04/14/2019 FINAL  Final  Urine culture     Status: None   Collection Time: 04/17/2019 11:23 AM   Specimen: In/Out Cath Urine  Result Value Ref Range Status   Specimen Description IN/OUT CATH URINE  Final   Special Requests NONE  Final   Culture   Final    NO GROWTH Performed at [facility], [place] [address], [place], [place] [address]    Report Status 04/10/2019 FINAL  Final  Blood Culture (routine x 2)     Status: None   Collection Time: 04/16/2019 11:28 AM   Specimen: BLOOD  Result Value Ref Range Status   Specimen Description BLOOD LEFT ANTECUBITAL  Final   Special Requests   Final    BOTTLES DRAWN AEROBIC AND ANAEROBIC Blood Culture results may not be optimal due  to an excessive volume of blood received in culture bottles   Culture   Final    NO GROWTH 5 DAYS Performed at [facility], [place] [address], [place], [place] [address]    Report Status 04/14/2019 FINAL  Final  SARS Coronavirus 2 (CEPHEID - Performed in [place] hospital lab), Hosp Order     Status: Abnormal   Collection Time: 04/17/2019 12:01 PM   Specimen: Nasopharyngeal Swab  Result Value Ref Range Status   SARS Coronavirus 2 POSITIVE (A) NEGATIVE Final    Comment: RESULT CALLED TO, READ BACK BY AND VERIFIED WITH: Claretta Fraise RN 13:45 04/23/2019 ([email or web address]) (NOTE) If result is NEGATIVE SARS-CoV-2 target nucleic acids are NOT DETECTED. The SARS-CoV-2 RNA is generally detectable in upper and lower  respiratory specimens during the acute phase of infection. The lowest  concentration of SARS-CoV-2 viral copies this assay can detect is 250  copies / mL. A negative result does not preclude SARS-CoV-2 infection  and should not be used as the sole basis for treatment or other  patient management decisions.  A negative result may occur with  improper specimen collection / handling, submission of specimen other  than nasopharyngeal swab, presence of viral mutation(s) within the  areas targeted  by this assay, and inadequate number of viral copies  (<250 copies / mL). A negative result must be combined with clinical  observations, patient history, and epidemiological information. If result is POSITIVE SARS-CoV-2 target nucleic acids are DETECTED.  The SARS-CoV-2 RNA is generally detectable in upper and lower  respiratory specimens during the acute phase of infection.  Positive  results are indicative of active infection with SARS-CoV-2.  Clinical  correlation with patient history and other diagnostic information is  necessary to determine patient infection status.  Positive results do  not rule out bacterial infection or co-infection with other viruses. If result is PRESUMPTIVE POSTIVE SARS-CoV-2 nucleic acids MAY BE PRESENT.   A presumptive positive result was obtained on the submitted specimen  and confirmed on repeat testing.  While 2019 novel coronavirus  (SARS-CoV-2) nucleic acids may be present in the submitted sample  additional confirmatory testing may be necessary for epidemiological  and / or clinical management purposes  to differentiate between  SARS-CoV-2 and other Sarbecovirus currently known to infect humans.  If clinically indicated additional testing with an alternate test  methodology [phone]) i s advised. The SARS-CoV-2 RNA is generally  detectable in upper and lower respiratory specimens during the acute  phase of infection. The expected result is Negative. Fact Sheet for Patients:  StrictlyIdeas.no Fact Sheet for Healthcare Providers: BankingDealers.co.za This test is not yet approved or cleared by the [place] FDA and has been authorized for detection and/or diagnosis of SARS-CoV-2 by FDA under an Emergency Use Authorization (EUA).  This EUA will remain in effect (meaning this test can be used) for the duration of the COVID-19 declaration under Section 564(b)(1) of the Act, 21 U.S.C. section  360bbb-3(b)(1), unless the authorization is terminated or revoked sooner. Performed at [facility], [place] [address] [place], [place] [address]          Radiology Studies: No results found.      Scheduled Meds: . chlorhexidine  15 mL Mouth Rinse BID  . methylPREDNISolone (SOLU-MEDROL) injection  30 mg Intravenous Daily   Continuous Infusions: . dextrose 75 mL/hr at 04/17/19 1921     LOS: 9 days    Time spent: 15 minutes    Harrell Gave  Shirleen Schirmer, MD [facility] 04/18/2019, 1:39 PM     Please page through [place]:  [email or web address] Password [email or web address] If 7PM-7AM, please contact night-coverage

## 2019-04-18 NOTE — Progress Notes (Signed)
[facility]   Continue to follow for [facility]. Unfortunately [facility] is not able to offer a room today. Will continue to follow and update TOC manager and family if availability changes.   Thank you,  Erling Conte, LCSW [phone]

## 2019-04-19 NOTE — Progress Notes (Signed)
Called and spoke with [name]- patient's spouse. This RN asked if wife wanted to set up facetime or speak with the patient. Spouse said that she would prefer to wait until tomorrow morning to let the patient rest. This RN asked if spouse had any questions at this time. Spouse denied.

## 2019-04-19 NOTE — Progress Notes (Signed)
PROGRESS NOTE    Trevione Wert [facility]  MEQ:[id number] DOB: [birth year] DOA: 04/18/2019 PCP: Josetta Huddle, MD      Brief Narrative:  Thomas Bridges is a 73 y.o. M with advanced dementia and HTN who presented with weakness, fever, found to have SARS-CoV-2 infection, AKI, and suspected bacterial CAP.  Started on empiric ceftriaxone and azithromycin, as well as remdesivir and steroids and admitted.       Assessment & Plan:  Acute metabolic encephalopathy due to COVID-19 Coronavirus pneumonitis with acute hypoxic respiratory failure Failure to thrive Minimal respiratory failure from COVID, not requiring supplemental O2 at present. Has had marked decline in mentation, now appears to be actively dying.  Transitioned to comfort care, see note from 7/15.  -Comfort measures -q2 turns -q2 oral care -Continue PRN morphine for pain or dyspnea    Dementia  Possible community acquired pneumonia Completed 5 days azithromycin and ceftriaxone.  Hypernatremia -Full comfort measures  Hypertension BP normal yesterday, comfort measures now, no further BP checks necessary.  Acute kidney injury  Anemia of chronic disease No reports of bleeding.       MDM and disposition: The patient was admitted COVID-19 and acute metabolic encephalopathy.  He has had no meaningful neurologic recovery, and is unable to maintain adequate nutrition.  In discussion with palliative care and family, patient was made comfort measures, and we await residential hospice bed.  In the interim for finding palliative cares to this patient who appears to be actively dying over the next days to weeks.     DVT prophylaxis: N/A, comfort measures Code Status: DNR Family Communication:  Will call to wife this afternoon for daily update    Consultants:   Palliative Care  Procedures:   None  Antimicrobials:   Ceftriaxone 7/9 - 7/13  Azithromycin 7/9 - 7/13  Culture data:   7/9 blood culture x2 -- NG  7/9  urine culture -- NG       Subjective: Unable to answer questions.  Minimal response to stimuli.  No fever.      Objective: Vitals:   04/18/19 0800 04/18/19 0922 04/18/19 1600 04/19/19 0800  BP: 140/73  (!) 123/58 (!) 87/48  Pulse: 84  79 66  Resp:      Temp: 98.4 F (36.9 C)  98.3 F (36.8 C) 97.8 F (36.6 C)  TempSrc: Oral  Oral Oral  SpO2: 98% 98% 96% 100%  Weight:      Height:        Intake/Output Summary (Last 24 hours) at 04/19/2019 1015 Last data filed at 04/19/2019 0400 Gross per 24 hour  Intake -  Output 2200 ml  Net -2200 ml   Filed Weights   04/28/2019 2030 04/02/2019 2033  Weight: 64.4 kg 64.4 kg    Examination: General appearance: Frail elderly male, lying in bed, staring at the ceiling, opens eyes to stimuli, no other response.   HEENT: sclera, conjunctiva normal.  OP dry.  Edentulous, lips dry.  Oropharynx dry, no oral lesions. Skin: Dry, tented Cardiac: Heart rate regular, no murmurs, no lower extremity edema. Respiratory: Respirations unlabored, no grunting, respirations shallow, no wheezing.  Diminished throughout. Abdomen: Abdomen soft, no guarding or grimace. MSK: Diffuse loss of muscle and fat Neuro: Staring, no response to stimuli, blinks to touch, no spontaneous verbalizations. Psych: Unable to assess.      Data Reviewed: I have personally reviewed following labs and imaging studies:  CBC: Recent Labs  Lab 04/13/19 0341 04/14/19 0420  WBC  6.8 6.7  NEUTROABS 5.5 5.3  HGB 10.1* 10.0*  HCT 32.5* 31.9*  MCV 93.1 91.7  PLT 238 161   Basic Metabolic Panel: Recent Labs  Lab 04/13/19 0341 04/14/19 0420  NA 148* 142  K 3.7 3.5  CL 118* 111  CO2 22 24  GLUCOSE 163* 165*  BUN 40* 30*  CREATININE 0.78 0.71  CALCIUM 8.0* 7.6*  MG 2.1 2.0  PHOS 2.4* 2.3*   GFR: Estimated Creatinine Clearance: 73.8 mL/min (by C-G formula based on SCr of 0.71 mg/dL). Liver Function Tests: Recent Labs  Lab 04/13/19 0341 04/14/19 0420  AST 38  23  ALT 83* 68*  ALKPHOS 81 82  BILITOT 0.4 0.3  PROT 5.4* 5.2*  ALBUMIN 2.6* 2.4*   No results for input(s): LIPASE, AMYLASE in the last 168 hours. No results for input(s): AMMONIA in the last 168 hours. Coagulation Profile: No results for input(s): INR, PROTIME in the last 168 hours. Cardiac Enzymes: No results for input(s): CKTOTAL, CKMB, CKMBINDEX, TROPONINI in the last 168 hours. BNP (last 3 results) No results for input(s): PROBNP in the last 8760 hours. HbA1C: No results for input(s): HGBA1C in the last 72 hours. CBG: No results for input(s): GLUCAP in the last 168 hours. Lipid Profile: No results for input(s): CHOL, HDL, LDLCALC, TRIG, CHOLHDL, LDLDIRECT in the last 72 hours. Thyroid Function Tests: No results for input(s): TSH, T4TOTAL, FREET4, T3FREE, THYROIDAB in the last 72 hours. Anemia Panel: No results for input(s): VITAMINB12, FOLATE, FERRITIN, TIBC, IRON, RETICCTPCT in the last 72 hours. Urine analysis:    Component Value Date/Time   COLORURINE YELLOW 04/08/2019 1123   APPEARANCEUR HAZY (A) 04/22/2019 1123   LABSPEC 1.026 04/17/2019 1123   PHURINE 5.0 04/15/2019 1123   GLUCOSEU NEGATIVE 04/19/2019 1123   HGBUR NEGATIVE 04/18/2019 1123   [place] 04/01/2019 1123   KETONESUR NEGATIVE 04/08/2019 1123   PROTEINUR 100 (A) 04/04/2019 1123   NITRITE NEGATIVE 04/08/2019 1123   LEUKOCYTESUR NEGATIVE 04/23/2019 1123   Sepsis Labs: @LABRCNTIP (procalcitonin:4,lacticacidven:4)  ) Recent Results (from the past 240 hour(s))  Blood Culture (routine x 2)     Status: None   Collection Time: 04/20/2019 11:23 AM   Specimen: BLOOD RIGHT FOREARM  Result Value Ref Range Status   Specimen Description BLOOD RIGHT FOREARM  Final   Special Requests   Final    BOTTLES DRAWN AEROBIC AND ANAEROBIC Blood Culture adequate volume   Culture   Final    NO GROWTH 5 DAYS Performed at [facility], [place] [address] [place], [place] [address]    Report Status  04/14/2019 FINAL  Final  Urine culture     Status: None   Collection Time: 04/26/2019 11:23 AM   Specimen: In/Out Cath Urine  Result Value Ref Range Status   Specimen Description IN/OUT CATH URINE  Final   Special Requests NONE  Final   Culture   Final    NO GROWTH Performed at [facility], [place] [address], [place], [place] [address]    Report Status 04/10/2019 FINAL  Final  Blood Culture (routine x 2)     Status: None   Collection Time: 04/21/2019 11:28 AM   Specimen: BLOOD  Result Value Ref Range Status   Specimen Description BLOOD LEFT ANTECUBITAL  Final   Special Requests   Final    BOTTLES DRAWN AEROBIC AND ANAEROBIC Blood Culture results may not be optimal due to an excessive volume of blood received in culture bottles  Culture   Final    NO GROWTH 5 DAYS Performed at [facility], [place] [address], [place], [place] [address]    Report Status 04/14/2019 FINAL  Final  SARS Coronavirus 2 (CEPHEID - Performed in [facility] [facility] lab), Hosp Order     Status: Abnormal   Collection Time: 04/06/2019 12:01 PM   Specimen: Nasopharyngeal Swab  Result Value Ref Range Status   SARS Coronavirus 2 POSITIVE (A) NEGATIVE Final    Comment: RESULT CALLED TO, READ BACK BY AND VERIFIED WITH: Claretta Fraise RN 13:45 04/04/2019 ([email or web address]) (NOTE) If result is NEGATIVE SARS-CoV-2 target nucleic acids are NOT DETECTED. The SARS-CoV-2 RNA is generally detectable in upper and lower  respiratory specimens during the acute phase of infection. The lowest  concentration of SARS-CoV-2 viral copies this assay can detect is 250  copies / mL. A negative result does not preclude SARS-CoV-2 infection  and should not be used as the sole basis for treatment or other  patient management decisions.  A negative result may occur with  improper specimen collection / handling, submission of specimen other  than nasopharyngeal swab, presence of viral mutation(s) within the  areas targeted by this assay, and  inadequate number of viral copies  (<250 copies / mL). A negative result must be combined with clinical  observations, patient history, and epidemiological information. If result is POSITIVE SARS-CoV-2 target nucleic acids are DETECTED.  The SARS-CoV-2 RNA is generally detectable in upper and lower  respiratory specimens during the acute phase of infection.  Positive  results are indicative of active infection with SARS-CoV-2.  Clinical  correlation with patient history and other diagnostic information is  necessary to determine patient infection status.  Positive results do  not rule out bacterial infection or co-infection with other viruses. If result is PRESUMPTIVE POSTIVE SARS-CoV-2 nucleic acids MAY BE PRESENT.   A presumptive positive result was obtained on the submitted specimen  and confirmed on repeat testing.  While 2019 novel coronavirus  (SARS-CoV-2) nucleic acids may be present in the submitted sample  additional confirmatory testing may be necessary for epidemiological  and / or clinical management purposes  to differentiate between  SARS-CoV-2 and other Sarbecovirus currently known to infect humans.  If clinically indicated additional testing with an alternate test  methodology [phone]) i s advised. The SARS-CoV-2 RNA is generally  detectable in upper and lower respiratory specimens during the acute  phase of infection. The expected result is Negative. Fact Sheet for Patients:  StrictlyIdeas.no Fact Sheet for Healthcare Providers: BankingDealers.co.za This test is not yet approved or cleared by the [place] FDA and has been authorized for detection and/or diagnosis of SARS-CoV-2 by FDA under an Emergency Use Authorization (EUA).  This EUA will remain in effect (meaning this test can be used) for the duration of the COVID-19 declaration under Section 564(b)(1) of the Act, 21 U.S.C. section 360bbb-3(b)(1), unless the  authorization is terminated or revoked sooner. Performed at [facility], [place] [address] [place],  [address]          Radiology Studies: No results found.      Scheduled Meds: . chlorhexidine  15 mL Mouth Rinse BID   Continuous Infusions: . dextrose 75 mL/hr at 04/17/19 1921     LOS: 10 days    Time spent: 15 minutes    Edwin Dada, MD [facility] 04/19/2019, 10:15 AM     Please page through [place]:  [email or web address] Password [email or web address] If 7PM-7AM,  please contact night-coverage

## 2019-04-19 NOTE — Progress Notes (Signed)
Reviewed Epic chart.  Discussed with Dr. Loleta Books.  Patient receiving excellent RN care.  PMT will be available to support the patient, family and medical team as needed.    Florentina Jenny, PA-C Palliative Medicine Pager: [phone]   No charge note.

## 2019-04-20 NOTE — Progress Notes (Signed)
PROGRESS NOTE    Dallon Dacosta [facility]  SLH:[id number] DOB: Apr 23, 1946 DOA: 04/26/2019 PCP: Josetta Huddle, MD      Brief Narrative:  Mr. Haliburton is a 73 y.o. M with advanced dementia and HTN who presented with weakness, fever, found to have SARS-CoV-2 infection, AKI, and suspected bacterial CAP.  Started on empiric ceftriaxone and azithromycin, as well as remdesivir and steroids and admitted.       Assessment & Plan:  Acute metabolic encephalopathy due to COVID-19 Coronavirus pneumonitis with acute hypoxic respiratory failure Failure to thrive Minimal respiratory failure from COVID, not requiring supplemental O2 at present. Has had marked decline in mentation, now appears to be actively dying.  Transitioned to comfort care, see note from 7/15.  -Comfort measures -Continue q2 turns -Continue q2 oral care -Continue PRN morphine for pain or dyspnea    Dementia  Possible community acquired pneumonia Completed 5 days azithromycin and ceftriaxone.  Hypernatremia -Full comfort measures  Hypertension BP normal yesterday, comfort measures now, no further BP checks necessary.  Acute kidney injury  Anemia of chronic disease No reports of bleeding.       MDM and disposition: The patient was admitted COVID-19 and acute metabolic encephalopathy.  He has had no meaningful neurologic recovery, and is unable to maintain adequate nutrition.  In discussion with palliative care and family, patient was made comfort measures, and we await residential hospice bed.  In the interim for finding palliative cares to this patient who appears to be actively dying over the next days to weeks.     DVT prophylaxis: N/A, comfort measures Code Status: DNR Family Communication:  Will call to wife this afternoon for daily update    Consultants:   Palliative Care  Procedures:   None  Antimicrobials:   Ceftriaxone 7/9 - 7/13  Azithromycin 7/9 - 7/13  Culture data:   7/9 blood  culture x2 -- NG  7/9 urine culture -- NG       Subjective: Unable to answer questions.  No response to stimuli today.  No fever, no cough.        Objective: Vitals:   04/19/19 0800 04/19/19 2007 04/20/19 0440 04/20/19 0746  BP: (!) 87/48 94/62 (!) 98/53   Pulse: 66 87 74   Resp:  20 19 18   Temp: 97.8 F (36.6 C) (!) 100.8 F (38.2 C) 99.8 F (37.7 C) 99 F (37.2 C)  TempSrc: Oral Axillary Oral Axillary  SpO2: 100% 99% 99% 99%  Weight:      Height:        Intake/Output Summary (Last 24 hours) at 04/20/2019 1634 Last data filed at 04/20/2019 0800 Gross per 24 hour  Intake 1243.46 ml  Output 700 ml  Net 543.46 ml   Filed Weights   04/01/2019 2030 04/20/2019 2033  Weight: 64.4 kg 64.4 kg    Examination: General appearance: Frail elderly male, appears near death.  Skin: Dry, tented Cardiac: RRR, no murmurs.  No edema Respiratory: Respirations shallow, no rales Abdomen: Abdomen soft, no guarding or grimace. Neuro: Staring, no response to stimuli, blinks to touch, no spontaneous verbalizations. Psych: Unable to assess.      Data Reviewed: I have personally reviewed following labs and imaging studies:  CBC: Recent Labs  Lab 04/14/19 0420  WBC 6.7  NEUTROABS 5.3  HGB 10.0*  HCT 31.9*  MCV 91.7  PLT 157   Basic Metabolic Panel: Recent Labs  Lab 04/14/19 0420  NA 142  K 3.5  CL 111  CO2 24  GLUCOSE 165*  BUN 30*  CREATININE 0.71  CALCIUM 7.6*  MG 2.0  PHOS 2.3*   GFR: Estimated Creatinine Clearance: 73.8 mL/min (by C-G formula based on SCr of 0.71 mg/dL). Liver Function Tests: Recent Labs  Lab 04/14/19 0420  AST 23  ALT 68*  ALKPHOS 82  BILITOT 0.3  PROT 5.2*  ALBUMIN 2.4*   No results for input(s): LIPASE, AMYLASE in the last 168 hours. No results for input(s): AMMONIA in the last 168 hours. Coagulation Profile: No results for input(s): INR, PROTIME in the last 168 hours. Cardiac Enzymes: No results for input(s): CKTOTAL, CKMB,  CKMBINDEX, TROPONINI in the last 168 hours. BNP (last 3 results) No results for input(s): PROBNP in the last 8760 hours. HbA1C: No results for input(s): HGBA1C in the last 72 hours. CBG: No results for input(s): GLUCAP in the last 168 hours. Lipid Profile: No results for input(s): CHOL, HDL, LDLCALC, TRIG, CHOLHDL, LDLDIRECT in the last 72 hours. Thyroid Function Tests: No results for input(s): TSH, T4TOTAL, FREET4, T3FREE, THYROIDAB in the last 72 hours. Anemia Panel: No results for input(s): VITAMINB12, FOLATE, FERRITIN, TIBC, IRON, RETICCTPCT in the last 72 hours. Urine analysis:    Component Value Date/Time   COLORURINE YELLOW 04/13/2019 1123   APPEARANCEUR HAZY (A) 04/20/2019 1123   LABSPEC 1.026 04/24/2019 1123   PHURINE 5.0 04/19/2019 1123   GLUCOSEU NEGATIVE 04/28/2019 1123   HGBUR NEGATIVE 04/07/2019 1123   BILIRUBINUR NEGATIVE 04/23/2019 1123   KETONESUR NEGATIVE 04/06/2019 1123   PROTEINUR 100 (A) 04/08/2019 1123   NITRITE NEGATIVE 04/12/2019 1123   LEUKOCYTESUR NEGATIVE 04/02/2019 1123   Sepsis Labs: @LABRCNTIP (procalcitonin:4,lacticacidven:4)  ) No results found for this or any previous visit (from the past 240 hour(s)).       Radiology Studies: No results found.      Scheduled Meds: . chlorhexidine  15 mL Mouth Rinse BID   Continuous Infusions:    LOS: 11 days    Time spent: 15 minutes    Edwin Dada, MD [facility] 04/20/2019, 4:34 PM     Please page through [place]:  [email or web address] Password [email or web address] If 7PM-7AM, please contact night-coverage

## 2019-04-20 NOTE — Progress Notes (Signed)
Wife called this AM. Updated on events of last night. Plans for facetime later today.

## 2019-04-21 NOTE — Progress Notes (Addendum)
PROGRESS NOTE    Thomas Bridges [facility]  [id number]:[id number] DOB: Mar 26, 1946 DOA: 04/19/2019 PCP: Josetta Huddle, MD      Brief Narrative:  Mr. Yeagley is a 73 y.o. M with advanced dementia and HTN who presented with weakness, fever, found to have SARS-CoV-2 infection, AKI, and suspected bacterial CAP.  Started on empiric ceftriaxone and azithromycin, as well as remdesivir and steroids and admitted.       Assessment & Plan:  Acute metabolic encephalopathy due to COVID-19 Coronavirus pneumonitis with acute hypoxic respiratory failure Failure to thrive Minimal respiratory failure from COVID, not requiring supplemental O2 at present. Has had marked decline in mentation, now appears to be actively dying.  Transitioned to comfort care, see note from 7/15.  -Comfort measures -Continue q2 turns -Continue q2 oral care -Continue PRN morphine for pain or dyspnea    Dementia  Possible community acquired pneumonia Completed 5 days azithromycin and ceftriaxone.  Hypernatremia -Full comfort measures  Hypertension BP normal yesterday, comfort measures now, no further BP checks necessary.  Acute kidney injury  Anemia of chronic disease No reports of bleeding.       MDM and disposition: This is a no charge note.   The patient was admitted with COVID-19 and acute metabolic encephalopathy.  He had no meaningful neurologic recovery, and can take no oral nutrition.  As result of this, comfort measures were instituted, and we are awaiting a residential hospice bed.  In the interim, full comfort cares, all palliative measures necessary for this patient who appears to be actively dying over days possibly weeks.        DVT prophylaxis: N/A, comfort measures Code Status: DNR Family Communication:     Consultants:   Palliative Care  Procedures:   None  Antimicrobials:   Ceftriaxone 7/9 - 7/13  Azithromycin 7/9 - 7/13  Culture data:   7/9 blood culture x2 -- NG  7/9 urine  culture -- NG       Subjective: Unable to answer questions.  Patient does not open eyes to stimuli.  Nursing report no fever, cough, respiratory distress.      Objective: Vitals:   04/20/19 1600 04/20/19 1700 04/20/19 1945 04/21/19 0511  BP: (!) 131/57  116/61   Pulse: 86 83 85   Resp: (!) 22     Temp:  (!) 101.4 F (38.6 C) 99.8 F (37.7 C) 100.1 F (37.8 C)  TempSrc:  Axillary Axillary Axillary  SpO2:   95%   Weight:      Height:        Intake/Output Summary (Last 24 hours) at 04/21/2019 0732 Last data filed at 04/21/2019 4481 Gross per 24 hour  Intake 0 ml  Output 260 ml  Net -260 ml   Filed Weights   04/29/2019 2030 04/10/2019 2033  Weight: 64.4 kg 64.4 kg    Examination: General appearance: Frail elderly male, lying in bed, moribund, mouth open, staring at the ceiling.  Skin: Dry and tented Cardiac:, No murmurs, no edema. Respiratory: Respirations shallow and irregular, no rales or wheezes. Abdomen: No grimace to palpation, no distention.  No guarding or rigidity. Neuro: Staring, no response to stimuli, no spontaneous verbalizations. Psych: Unable to assess.      Data Reviewed: I have personally reviewed following labs and imaging studies:  CBC: No results for input(s): WBC, NEUTROABS, HGB, HCT, MCV, PLT in the last 168 hours. Basic Metabolic Panel: No results for input(s): NA, K, CL, CO2, GLUCOSE, BUN, CREATININE, CALCIUM, MG, PHOS  in the last 168 hours. GFR: Estimated Creatinine Clearance: 73.8 mL/min (by C-G formula based on SCr of 0.71 mg/dL). Liver Function Tests: No results for input(s): AST, ALT, ALKPHOS, BILITOT, PROT, ALBUMIN in the last 168 hours. No results for input(s): LIPASE, AMYLASE in the last 168 hours. No results for input(s): AMMONIA in the last 168 hours. Coagulation Profile: No results for input(s): INR, PROTIME in the last 168 hours. Cardiac Enzymes: No results for input(s): CKTOTAL, CKMB, CKMBINDEX, TROPONINI in the last 168  hours. BNP (last 3 results) No results for input(s): PROBNP in the last 8760 hours. HbA1C: No results for input(s): HGBA1C in the last 72 hours. CBG: No results for input(s): GLUCAP in the last 168 hours. Lipid Profile: No results for input(s): CHOL, HDL, LDLCALC, TRIG, CHOLHDL, LDLDIRECT in the last 72 hours. Thyroid Function Tests: No results for input(s): TSH, T4TOTAL, FREET4, T3FREE, THYROIDAB in the last 72 hours. Anemia Panel: No results for input(s): VITAMINB12, FOLATE, FERRITIN, TIBC, IRON, RETICCTPCT in the last 72 hours. Urine analysis:    Component Value Date/Time   COLORURINE YELLOW 04/03/2019 1123   APPEARANCEUR HAZY (A) 04/26/2019 1123   LABSPEC 1.026 04/04/2019 1123   PHURINE 5.0 04/26/2019 1123   GLUCOSEU NEGATIVE 04/15/2019 1123   HGBUR NEGATIVE 04/24/2019 1123   BILIRUBINUR NEGATIVE 04/22/2019 1123   KETONESUR NEGATIVE 04/18/2019 1123   PROTEINUR 100 (A) 04/22/2019 1123   NITRITE NEGATIVE 04/24/2019 1123   LEUKOCYTESUR NEGATIVE 04/07/2019 1123   Sepsis Labs: @LABRCNTIP (procalcitonin:4,lacticacidven:4)  ) No results found for this or any previous visit (from the past 240 hour(s)).       Radiology Studies: No results found.      Scheduled Meds: . chlorhexidine  15 mL Mouth Rinse BID   Continuous Infusions:    LOS: 12 days    Time spent: 15 minutes    Edwin Dada, MD [facility] 04/21/2019, 7:32 AM     Please page through [place]:  [email or web address] Password [email or web address] If 7PM-7AM, please contact night-coverage

## 2019-04-21 NOTE — Care Management (Addendum)
CM spoke with pts wife [name] regarding [facility] still not having a covid positive bed as of yet.  CM communicated with both SW AD and attending.  CM spoke with pts wife to offer additional choices for residential hospice in another county.  Wife was agreeable to [place] is also under [facility] and the same barrier applies.    CM searched for closest residential hospice homes in neighboring counties.  CM contacted [facility] (35 miles from pts zipcode)  - agency has merged with [facility] to serve COVID pts - however wife communicated the distance is too far to [facility] (37 mins from pts zipcode) as she also has health issues.  [facility] will require 2 negative test at least 24 hours apart - attending made aware and test will be ordered.

## 2019-04-21 NOTE — Progress Notes (Signed)
Manufacturing engineer [facility]) [place] does not have a bed to offer Mr. [place] today.  ACC will update family and TOC manager should bed availability change.  Thank you, Venia Carbon RN, BSN, [facility] Liaison (in [place]) [phone]

## 2019-04-21 NOTE — Progress Notes (Signed)
Called wife to update on pt condition

## 2019-04-21 NOTE — Progress Notes (Addendum)
Patient's wife called for an evening update. All questions answered. Spouse thankful for the information and care. Will continue to closely monitor patient.

## 2019-04-22 MED ORDER — CHLORHEXIDINE GLUCONATE 0.12 % MT SOLN: 15.0000 mL | Freq: Two times a day (BID) | OROMUCOSAL | Status: DC

## 2019-04-22 MED ORDER — ORAL CARE MOUTH RINSE
15.0000 mL | Freq: Two times a day (BID) | OROMUCOSAL | Status: DC
  Administered 2019-04-22 – 2019-04-27 (×9): 15 mL via OROMUCOSAL

## 2019-04-22 NOTE — Progress Notes (Signed)
  PROGRESS NOTE  Fredi Geiler [facility]  [id number] DOB: Apr 09, 1946 DOA: 04/01/2019 PCP: Josetta Huddle, MD   Brief Narrative: Thomas [facility] is a 73 y.o. male with a history of advanced dementia and HTN who presented 7/9 with weakness and fever, found to be infected with SARS-CoV-2 with suspected superimposed bacterial pneumonia and AKI. Steroids, remdesivir, ceftriaxone and azithromycin were started upon admission. Antimicrobials were continued for 5 days. Due to debility, PT recommended SNF admission. With no significant clinical improvement, goals of care were discussed with the family who opted for hospice services. Palliative care has been involved as well. The patient has since appeared to be transitioning toward the end-of-life, and discharge to residential hospice is planned, pending bed availability.  Assessment & Plan: Active Problems:   Pneumonia due to COVID-19 virus   CAP (community acquired pneumonia)   AKI (acute kidney injury) ([place])   Dementia ([place])   COVID-19 virus detected   Comfort measures only status   Palliative care encounter  Acute metabolic encephalopathy on chronic, severe, progressive dementia, failure to thrive: Covid contributing, and now appears to be actively dying.  - Comfort measures, including delirium precautions - Hospice consulted for placement, awaiting bed availability. Repeat testing 7/21 pending. Would need 2 tests negative >24hrs apart.  Covid-19 infection and suspected superimposed bacterial PNA: s/p remdesivir, CTX, azithromycin, and steroids.  Hypernatremia:  - No further management planned. Comfort feeds as desired.   AKI:  - No further management planned  HTN:  - No medications indicated  Anemia of chronic disease:  - No further management planned.  DVT prophylaxis: None, comfort measures Code Status: DNR Family Communication: None at bedside. Wife updated by RN this morning. Will call with any change in status. Disposition Plan: Hospice  when bed available.  Consultants:   Palliative care  Procedures:   None  Antimicrobials:  Azithromycin, ceftriaxone, remdesivir   Subjective: Not meaningfully responsive, will open eyes but not track. Not eating.  Objective: Vitals:   04/21/19 2010 04/21/19 2155 04/22/19 0249 04/22/19 0850  BP: 114/60  114/68 106/60  Pulse: 89  92 85  Resp:      Temp:  99.1 F (37.3 C)  99.6 F (37.6 C)  TempSrc:  Axillary  Axillary  SpO2: 93%  93% 94%  Weight:      Height:        Intake/Output Summary (Last 24 hours) at 04/22/2019 1505 Last data filed at 04/22/2019 0113 Gross per 24 hour  Intake 0 ml  Output 550 ml  Net -550 ml   Filed Weights   04/03/2019 2030 04/14/2019 2033  Weight: 64.4 kg 64.4 kg    Gen: Chronically ill-appearing elderly male in no acute distress Pulm: Non-labored breathing with mouth wide open. Clear to auscultation bilaterally.  CV: Regular rate and rhythm. No murmur, rub, or gallop. No JVD, no pedal edema. GI: Abdomen soft, non-tender, non-distended, with normoactive bowel sounds. No organomegaly or masses felt. Ext: Warm, no deformities Skin: No rashes, lesions or ulcers Neuro: Not oriented or verbal. Psych: Calm   LOS: 13 days   Time spent: 25 minutes.  Patrecia Pour, MD [facility] [email or web address] Password [facility] 04/22/2019, 3:05 PM

## 2019-04-22 NOTE — Progress Notes (Signed)
Spoke with patients wife on phone and assisted a facetime call with patient and his wife. Ellamae Sia

## 2019-04-22 NOTE — Progress Notes (Signed)
Updated wife via phone at 1000 am. Ellamae Sia

## 2019-04-23 LAB — NOVEL CORONAVIRUS, NAA (HOSP ORDER, SEND-OUT TO REF LAB; TAT 18-24 HRS): SARS-CoV-2, NAA: DETECTED — AB

## 2019-04-23 NOTE — Progress Notes (Signed)
  PROGRESS NOTE  Thomas Bridges  [id number] DOB: 1945-10-17 DOA: 04/03/2019 PCP: Josetta Huddle, MD   Brief Narrative: Thomas Bridges is a 73 y.o. male with a history of advanced dementia and HTN who presented 7/9 with weakness and fever, found to be infected with SARS-CoV-2 with suspected superimposed bacterial pneumonia and AKI. Steroids, remdesivir, ceftriaxone and azithromycin were started upon admission. Antimicrobials were continued for 5 days. Due to debility, PT recommended SNF admission. With no significant clinical improvement, goals of care were discussed with the family who opted for hospice services. Palliative care has been involved as well. The patient has since appeared to be transitioning toward the end-of-life, so comfort measures have been implemented and discharge to residential hospice is pending. Unfortunately facilities requested by the family require negative covid testing for admission and on most recent repeat test from 7/21 remains positive.  Assessment & Plan: Active Problems:   Pneumonia due to COVID-19 virus   CAP (community acquired pneumonia)   AKI (acute kidney injury) ([place])   Dementia ([place])   COVID-19 virus detected   Comfort measures only status   Palliative care encounter  Acute metabolic encephalopathy on chronic, severe, progressive dementia, failure to thrive: Covid contributing, and now appears to be actively dying.  - Comfort measures, including delirium precautions - Hospice consulted for placement, unable to accept covid-positive patients. Repeat test 7/21 remains positive.   Covid-19 infection and suspected superimposed bacterial PNA: s/p remdesivir, CTX, azithromycin, and steroids.   Hypernatremia: Improved on last check. - No further management planned. Comfort feeds as desired.   AKI:  - No further management planned  HTN:  - No medications indicated  Anemia of chronic disease:  - No further management planned.  DVT prophylaxis:  None, comfort measures Code Status: DNR Family Communication: None at bedside. Spouse by phone. Disposition Plan: [facility] pending negative covid testing. This may not occur prior to patient's death in the [facility].  Consultants:   Palliative care  Procedures:   None  Antimicrobials:  Azithromycin, ceftriaxone, remdesivir   Subjective: Sleeping with mouth wide open as I enter the room. Opens eyes, tracks somewhat, but not verbal or moving body. No overnight events noted.  Objective: Vitals:   04/22/19 0249 04/22/19 0850 04/22/19 1654 04/23/19 1008  BP: 114/68 106/60 (!) 99/59   Pulse: 92 85 75   Resp:      Temp:  99.6 F (37.6 C) (!) 97.3 F (36.3 C)   TempSrc:  Axillary Axillary   SpO2: 93% 94% 93% 95%  Weight:      Height:        Intake/Output Summary (Last 24 hours) at 04/23/2019 1225 Last data filed at 04/23/2019 0400 Gross per 24 hour  Intake -  Output 350 ml  Net -350 ml   Filed Weights   04/01/2019 2030 04/06/2019 2033  Weight: 64.4 kg 64.4 kg   Gen: Acutely and chronically ill-appearing male in no distress HEENT: Edentulous, no mouth sores noted. Pulm: Nonlabored breathing room air. Clear. CV: Regular rate and rhythm. No murmur, rub, or gallop. No JVD, no dependent edema. GI: Abdomen soft, non-tender, non-distended, with normoactive bowel sounds.  Ext: Warm, no deformities Skin: No new rashes, lesions or ulcers on visualized skin. Neuro: Drowsy, rousable, not meaningfully responsive. Psych: Calm   LOS: 14 days   Time spent: 15 minutes.  Patrecia Pour, MD [facility] [email or web address] Password [facility] 04/23/2019, 12:25 PM

## 2019-04-23 NOTE — Plan of Care (Signed)
Assisted and son with facetiming patient

## 2019-04-23 NOTE — Plan of Care (Signed)
Updated wife on patient current status and assisted with facetime visit.

## 2019-04-24 NOTE — Progress Notes (Signed)
  PROGRESS NOTE  Thomas Bridges  [id number] DOB: 04-13-1946 DOA: 04/08/2019 PCP: Josetta Huddle, MD   Brief Narrative: Thomas Bridges is a 73 y.o. male with a history of advanced dementia and HTN who presented 7/9 with weakness and fever, found to be infected with SARS-CoV-2 with suspected superimposed bacterial pneumonia and AKI. Steroids, remdesivir, ceftriaxone and azithromycin were started upon admission on 7/9. Antimicrobials were continued for 5 days. Due to debility, PT recommended SNF admission. With no significant clinical improvement, goals of care were discussed with the family who opted for hospice services. Palliative care has been involved as well. The patient has since appeared to be transitioning toward the end-of-life, so comfort measures have been implemented and discharge to residential hospice is pending. Unfortunately facilities requested by the family require negative covid testing for admission and on most recent repeat test from 7/21 remains positive.  Repeat test ordered 7/24.  Patient resting comfortably, no complaints.  No events overnight.  Assessment & Plan: Active Problems:   Pneumonia due to COVID-19 virus   CAP (community acquired pneumonia)   AKI (acute kidney injury) ([place])   Dementia ([place])   COVID-19 virus detected   Comfort measures only status   Palliative care encounter  Acute metabolic encephalopathy on chronic, severe, progressive dementia, failure to thrive: Covid contributing, and now appears to be actively dying.  - Comfort measures, including delirium precautions - Hospice consulted for placement, unable to accept covid-positive patients. Repeat test 7/21 remains positive.  Repeat test ordered 7/24  Covid-19 infection and suspected superimposed bacterial PNA: s/p remdesivir, CTX, azithromycin, and steroids.   Hypernatremia: Improved on last check. - No further management planned. Comfort feeds as desired.   AKI:  - No further management planned   HTN:  - No medications indicated  Anemia of chronic disease:  - No further management planned.  DVT prophylaxis: None, comfort measures Code Status: DNR Family Communication: Updated wife by phone Disposition Plan: [facility] pending negative covid testing. This may not occur prior to patient's death in the [facility].  Consultants:   Palliative care  Procedures:   None  Antimicrobials:  Azithromycin, ceftriaxone, remdesivir    Objective: Vitals:   04/23/19 1008 04/23/19 2027 04/24/19 0800 04/24/19 0849  BP:  122/75 119/69   Pulse:  98 98   Resp:  18    Temp:  98.4 F (36.9 C)    TempSrc:  Axillary    SpO2: 95% 92% 93% 92%  Weight:      Height:        Intake/Output Summary (Last 24 hours) at 04/24/2019 1109 Last data filed at 04/24/2019 0931 Gross per 24 hour  Intake -  Output 400 ml  Net -400 ml   Filed Weights   04/29/2019 2030 04/05/2019 2033  Weight: 64.4 kg 64.4 kg   Gen: Somnolent, no acute distress Pulm: Clear to auscultation bilaterally, unlabored CV: Regular rate and rhythm.  GI: Abdomen soft, nondistended, hypoactive bowel sounds Ext: Warm, no deformities, no clubbing or cyanosis Neuro: Drowsy, rousable, not meaningfully responsive.   LOS: 15 days    Annita Brod, MD [facility] [email or web address] Password [facility] 04/24/2019, 11:09 AM

## 2019-04-25 LAB — NOVEL CORONAVIRUS, NAA (HOSP ORDER, SEND-OUT TO REF LAB; TAT 18-24 HRS): SARS-CoV-2, NAA: DETECTED — AB

## 2019-04-25 NOTE — Progress Notes (Signed)
Facetimed with pt's wife in the room to allow her to talk/see her husband. Pt responded briefly to wife's voice then went back to sleep. Discussed with pt's wife that pt is having to work harder at breathing. Wife verbalizes that she understands the process of dying and she is ok with giving the pt morphine to help ease the work of breathing. RN stated to wife that we will call her if pt appears to be getting worse and pt will not die alone. Wife verbalizes understanding and will call later this afternoon. Wife also verbalizes understanding that pt may not make [facility] to [facility]. Denies further questions.

## 2019-04-25 NOTE — Progress Notes (Signed)
Updated pt's wife on status. No changes during the day. Pt resting.

## 2019-04-25 NOTE — Progress Notes (Signed)
  PROGRESS NOTE  Thomas Bridges [facility]  [id number] DOB: 1946/01/13 DOA: 04/17/2019 PCP: Josetta Huddle, MD   Brief Narrative: Thomas Bridges is a 73 y.o. male with a history of advanced dementia and HTN who presented 7/9 with weakness and fever, found to be infected with SARS-CoV-2 with suspected superimposed bacterial pneumonia and AKI. Steroids, remdesivir, ceftriaxone and azithromycin were started upon admission on 7/9. Antimicrobials were continued for 5 days. Due to debility, PT recommended SNF admission. With no significant clinical improvement, goals of care were discussed with the family who opted for hospice services. Palliative care has been involved as well. The patient has since appeared to be transitioning toward the end-of-life, so comfort measures have been implemented and discharge to residential hospice is pending. Unfortunately facilities requested by the family require negative covid testing for admission and on most recent repeat test from 7/21 remains positive.  Repeat test ordered 7/24.    Patient still quite somnolent.  Some mild increased work of breathing  Assessment & Plan: Active Problems:   Pneumonia due to COVID-19 virus   CAP (community acquired pneumonia)   AKI (acute kidney injury) ([place])   Dementia ([place])   COVID-19 virus detected   Comfort measures only status   Palliative care encounter  Acute metabolic encephalopathy on chronic, severe, progressive dementia, failure to thrive: Covid contributing, and now appears to be actively dying.  - Comfort measures, including delirium precautions - Hospice consulted for placement, unable to accept covid-positive patients. Repeat test 7/21 remains positive.  Repeat test ordered 7/24.  He may not survive long enough to be transferred to hospice  Covid-19 infection and suspected superimposed bacterial PNA: s/p remdesivir, CTX, azithromycin, and steroids.   Hypernatremia: Improved on last check. - No further management planned.  Comfort feeds as desired.   AKI:  - No further management planned  HTN:  - No medications indicated  Anemia of chronic disease:  - No further management planned.  DVT prophylaxis: None, comfort measures Code Status: DNR Family Communication: Updated wife by phone Disposition Plan: [facility] pending negative covid testing. This may not occur prior to patient's death in the hospital.  Consultants:   Palliative care  Procedures:   None  Antimicrobials:  Azithromycin, ceftriaxone, remdesivir    Objective: Vitals:   04/25/19 0300 04/25/19 0400 04/25/19 0500 04/25/19 0800  BP:    119/69  Pulse: 97 (!) 103 (!) 102 (!) 102  Resp:    (!) 30  Temp:    99.9 F (37.7 C)  TempSrc:      SpO2: 94% 94% 95% 93%  Weight:      Height:        Intake/Output Summary (Last 24 hours) at 04/25/2019 1055 Last data filed at 04/24/2019 1831 Gross per 24 hour  Intake 0 ml  Output 100 ml  Net -100 ml   Filed Weights   04/13/2019 2030 04/14/2019 2033  Weight: 64.4 kg 64.4 kg   Gen: Somnolent, no acute distress Pulm: Clear to auscultation bilaterally, mild increase in labored breathing CV: Regular rate and rhythm.  Borderline tachycardia   LOS: 16 days    Annita Brod, MD [facility] [email or web address] Password [facility] 04/25/2019, 10:55 AM

## 2019-04-26 NOTE — Progress Notes (Signed)
  PROGRESS NOTE  Thomas [facility]ges [facility]  [id number] DOB: 05/28/1946 DOA: 04/25/2019 PCP: Thomas Huddle, MD   Brief Narrative: Thomas Bridges is a 73 y.o. male with a history of advanced dementia and HTN who presented 7/9 with weakness and fever, found to be infected with SARS-CoV-2 with suspected superimposed bacterial pneumonia and AKI. Steroids, remdesivir, ceftriaxone and azithromycin were started upon admission. Antimicrobials were continued for 5 days. Due to debility, PT recommended SNF admission. With no significant clinical improvement, goals of care were discussed with the family who opted for hospice services. Palliative care has been involved as well. The patient has since appeared to be transitioning toward the end-of-life, so comfort measures have been implemented and discharge to residential hospice is pending. Unfortunately facilities requested by the family require negative covid testing for admission and on most recent repeat test from 7/21 remains positive.  Assessment & Plan: Active Problems:   Pneumonia due to COVID-19 virus   CAP (community acquired pneumonia)   AKI (acute kidney injury) ([place])   Dementia ([place])   COVID-19 virus detected   Comfort measures only status   Palliative care encounter  Acute metabolic encephalopathy on chronic, severe, progressive dementia, failure to thrive: Covid contributing, and now appears to be actively dying.  - Comfort measures, including delirium precautions - Hospice consulted for placement, unable to accept covid-positive patients. Repeat test 7/21 and 7/24 remains positive. If clinically deteriorating will not retest. If stable, can consider retesting 7/27.  Covid-19 infection and suspected superimposed bacterial PNA: s/p remdesivir, CTX, azithromycin, and steroids.  - Morphine prn dyspnea.  Hypernatremia: Improved on last check. - No further management planned. Comfort feeds as desired.   AKI:  - No further management planned  HTN:   - No medications indicated  Anemia of chronic disease:  - No further management planned.  DVT prophylaxis: None, comfort measures Code Status: DNR Family Communication: None at bedside. Spouse by phone Disposition Plan: [facility] pending negative covid testing. This may not occur prior to patient's death in the hospital.  Consultants:   Palliative care  Procedures:   None  Antimicrobials:  Azithromycin, ceftriaxone, remdesivir   Subjective: Has developed fever and subsequent increased respiratory rate. Getting tylenol and morphine regularly.   Objective: Vitals:   04/25/19 0500 04/25/19 0800 04/25/19 1600 04/26/19 0758  BP:  119/69 119/76 120/74  Pulse: (!) 102 (!) 102 (!) 102 (!) 113  Resp:  (!) 30  (!) 35  Temp:  99.9 F (37.7 C) 99.2 F (37.3 C) (!) 101.8 F (38.8 C)  TempSrc:   Axillary Axillary  SpO2: 95% 93% 93% 91%  Weight:      Height:       No distress, resting with mouth open and eyes closed No accessory muscle use but tachypneic on room air, no crackles or wheezes Regular tachycardia without JVD, palpable pulses.    LOS: 17 days   Time spent: 15 minutes.  Thomas Pour, MD [facility] [email or web address] Password [facility] 04/26/2019, 12:04 PM

## 2019-04-26 NOTE — Progress Notes (Signed)
Spoke to pt wife. Stated she will call back to facetime later in the day.

## 2019-04-27 MED ORDER — MORPHINE 100MG IN NS 100ML (1MG/ML) PREMIX INFUSION
2.0000 mg/h | INTRAVENOUS | Status: DC
  Administered 2019-04-27: 2 mg/h via INTRAVENOUS
  Filled 2019-04-27: qty 100

## 2019-04-27 MED ORDER — MORPHINE BOLUS VIA INFUSION
2.0000 mg | INTRAVENOUS | Status: DC | PRN
  Administered 2019-04-27: 4 mg via INTRAVENOUS
  Administered 2019-04-27 (×2): 2 mg via INTRAVENOUS
  Filled 2019-04-27: qty 4
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
## 2019-05-02 NOTE — Progress Notes (Addendum)
CDS notified.  Referral [id number].  [employer].

## 2019-05-02 NOTE — Death Summary Note (Signed)
DEATH SUMMARY   Patient Details  Name: Thomas Bridges MRN: [id number] DOB: [birth year]  Admission/Discharge Information   Admit Date:  May 09, 2019  Date of Death: Date of Death: May 27, 2019  Time of Death: Time of Death: 01/23/19  Length of Stay: 01/16/23  Referring Physician: Josetta Huddle, MD   Reason(s) for Hospitalization  Covid-19 pneumonia  Diagnoses  Preliminary cause of death: Covid-19 pneumonia Secondary Diagnoses (including complications and co-morbidities):  Active Problems:   Pneumonia due to COVID-19 virus   CAP (community acquired pneumonia)   AKI (acute kidney injury) ([place])   Dementia ([place])   COVID-19 virus detected   Comfort measures only status   Palliative care encounter   Brief Hospital Course (including significant findings, care, treatment, and services provided and events leading to death)  Thomas Bridges is a 73 y.o. male with a history of advanced dementia and HTN who presented 05/09/23 with weakness and fever, found to be infected with SARS-CoV-2 with suspected superimposed bacterial pneumonia and AKI. Steroids, remdesivir, ceftriaxone and azithromycin were started upon admission. Antimicrobials were continued for 5 days. Due to debility, PT recommended SNF admission. With no significant clinical improvement, goals of care were discussed with the family who opted for hospice services. Palliative care has been involved as well. The patient has since appeared to be transitioning toward the end-of-life, so comfort measures have been implemented and discharge to residential hospice was pending. Unfortunately facilities requested by the family require negative covid testing for admission and on most recent repeat test from 7/21 remained positive. On 7/26 the patient exhibited heightened work of breathing which, after confirmatory discussion with HCPOA, was treated with morphine infusion. The patient subsequently died after the family had the opportunity at visitation.  Pertinent  Labs and Studies  Significant Diagnostic Studies Ct Head Wo Contrast  Result Date: May 09, 2019 CLINICAL DATA:  73 year old male with seizure. Altered level of consciousness. EXAM: CT HEAD WITHOUT CONTRAST TECHNIQUE: Contiguous axial images were obtained from the base of the skull through the vertex without intravenous contrast. COMPARISON:  03/12/2019 FINDINGS: Brain: No evidence of acute infarction, hemorrhage, hydrocephalus, extra-axial collection or mass lesion/mass effect. Mild atrophy and mild chronic small-vessel white matter ischemic changes again noted. Vascular: Carotid atherosclerotic calcifications noted Skull: Normal. Negative for fracture or focal lesion. Sinuses/Orbits: Small amount of fluid/mucosal thickening within the LEFT sphenoid sinus noted. Other: None IMPRESSION: 1. No evidence of acute intracranial abnormality. 2. Mild atrophy and chronic small-vessel white matter ischemic changes. 3. Small amount of fluid/mucosal thickening within the LEFT sphenoid sinus which may represent acute sinusitis. Electronically Signed   By: Margarette Canada M.D.   On: 2019-05-09 12:58   Dg Chest Portable 1 View  Result Date: 2019/05/09 CLINICAL DATA:  Cough, fever and altered mental status. EXAM: PORTABLE CHEST 1 VIEW COMPARISON:  03/12/2019 FINDINGS: The cardiac silhouette, mediastinal and hilar contours are within normal limits and stable. Vague airspace opacity noted in the right lower lobe in the posterior costophrenic sulcus, suspicious for infiltrate. The left lung is clear. The bony thorax is intact. IMPRESSION: Suspect right basilar infiltrate. Electronically Signed   By: Marijo Sanes M.D.   On: 05/09/19 11:43    Microbiology Recent Results (from the past 240 hour(s))  Novel Coronavirus, NAA (hospital order; send-out to ref lab)     Status: Abnormal   Collection Time: 04/21/19  5:31 PM   Specimen: Nasopharyngeal Swab; Respiratory  Result Value Ref Range Status   SARS-CoV-2, NAA DETECTED (A) NOT  DETECTED Final  Comment: (NOTE)                  Client Requested Flag This test was developed and its performance characteristics determined by [employer]. This test has not been FDA cleared or approved. This test has been authorized by FDA under an Emergency Use Authorization (EUA). This test is only authorized for the duration of time the declaration that circumstances exist justifying the authorization of the emergency use of in vitro diagnostic tests for detection of SARS-CoV-2 virus and/or diagnosis of COVID-19 infection under section 564(b)(1) of the Act, 21 U.S.C. 756EPP-2(R)(5), unless the authorization is terminated or revoked sooner. When diagnostic testing is negative, the possibility of a false negative result should be considered in the context of a patient's recent exposures and the presence of clinical signs and symptoms consistent with COVID-19. An individual without symptoms of COVID-19 and who is not shedding SARS-CoV-2 virus would expect to have a negative (not det ected) result in this assay. Performed At: [facility] [place], [place] [id number] Rush Farmer MD TK:[id number]    [facility]  Final    Comment: Performed at [facility] [address] [place], [place] [address]  Novel Coronavirus, NAA (hospital order; send-out to ref lab)     Status: Abnormal   Collection Time: 04/24/19  8:17 AM   Specimen: Nasopharyngeal Swab; Respiratory  Result Value Ref Range Status   SARS-CoV-2, NAA DETECTED (A) NOT DETECTED Final    Comment: (NOTE)                  Client Requested Flag This test was developed and its performance characteristics determined by [employer]. This test has not been FDA cleared or approved. This test has been authorized by FDA under an Emergency Use Authorization (EUA). This test is only authorized for the duration of time the declaration that circumstances  exist justifying the authorization of the emergency use of in vitro diagnostic tests for detection of SARS-CoV-2 virus and/or diagnosis of COVID-19 infection under section 564(b)(1) of the Act, 21 U.S.C. 025KYH-0(W)(2), unless the authorization is terminated or revoked sooner. When diagnostic testing is negative, the possibility of a false negative result should be considered in the context of a patient's recent exposures and the presence of clinical signs and symptoms consistent with COVID-19. An individual without symptoms of COVID-19 and who is not shedding SARS-CoV-2 virus would expect to have a negative (not det ected) result in this assay. Performed At: [facility] [place], [place] [id number] Rush Farmer MD VO:[id number]    [facility]  Final    Comment: Performed at [facility] [address], [place], [place] [address]    Procedures/Operations  None   Patrecia Pour, MD 04/28/2019, 5:07 PM

## 2019-05-02 NOTE — Progress Notes (Signed)
  PROGRESS NOTE  Thomas Bridges [facility]  OPF:[id number] DOB: 1945/12/01 DOA: 04/26/2019 PCP: Josetta Huddle, MD   Brief Narrative: Thomas Bridges is a 73 y.o. male with a history of advanced dementia and HTN who presented 7/9 with weakness and fever, found to be infected with SARS-CoV-2 with suspected superimposed bacterial pneumonia and AKI. Steroids, remdesivir, ceftriaxone and azithromycin were started upon admission. Antimicrobials were continued for 5 days. Due to debility, PT recommended SNF admission. With no significant clinical improvement, goals of care were discussed with the family who opted for hospice services. Palliative care has been involved as well. The patient has since appeared to be transitioning toward the end-of-life, so comfort measures have been implemented and discharge to residential hospice is pending. Unfortunately facilities requested by the family require negative covid testing for admission and on most recent repeat test from 7/21 remains positive.  Assessment & Plan: Active Problems:   Pneumonia due to COVID-19 virus   CAP (community acquired pneumonia)   AKI (acute kidney injury) ([place])   Dementia ([place])   COVID-19 virus detected   Comfort measures only status   Palliative care encounter  Acute metabolic encephalopathy on chronic, severe, progressive dementia, failure to thrive: Covid contributing, and now appears to be actively dying.  - Comfort measures, including delirium precautions - Hospice consulted for placement, unable to accept covid-positive patients. Repeat test 7/21 and 7/24 remains positive.  Covid-19 infection and suspected superimposed bacterial PNA: s/p remdesivir, CTX, azithromycin, and steroids.  - Morphine prn dyspnea. D/w RN who will give dose now and   Hypernatremia: Improved on last check. - No further management planned. Comfort feeds as desired.   AKI:  - No further management planned  HTN:  - No medications indicated  Anemia of chronic  disease:  - No further management planned.  DVT prophylaxis: None, comfort measures Code Status: DNR Family Communication: None at bedside. Spouse by phone. I've let her know my suspicion that he is in the final hours of life. Disposition Plan: Anticipate inpatient demise prior to ability to transfer to residential hospice.   Consultants:   Palliative care  Procedures:   None  Antimicrobials:  Azithromycin, ceftriaxone, remdesivir   Subjective: Increased respiratory rate over past 24 hours. +Fever.   Objective: BP (!) 101/58   Pulse (!) 112   Temp (!) 103 F (39.4 C) (Axillary)   Resp (!) 32   Ht 5\' 9"  (1.753 m)   Wt 64.4 kg   SpO2 (!) 89%   BMI 20.97 kg/m   Resting quietly in no distress Tachypneic without accessory muscle use Not tracking or moving extremities. Not responsive to touch or voice.    LOS: 18 days   Time spent: 15 minutes.  Patrecia Pour, MD [facility] [email or web address] Password [email or web address] 05/04/2019, 10:12 AM

## 2019-05-02 NOTE — Progress Notes (Signed)
Pt resting comfortably at this time. Daughter called and was able to face time and say her goodbyes. Pt is breathing very shallow and moving minimal air.

## 2019-05-02 NOTE — Progress Notes (Addendum)
Death pronounced 20:20 04-30-2019.  Verified by 2nd RN Belva Chimes.     Wife notified.    MD Dr. Bridgett Larsson notified.  Body prepared for morgue.

## 2019-05-02 NOTE — Progress Notes (Signed)
Pt given 4 mg morphine, without much change in his breathing. MD notified for morphine drip.

## 2019-05-02 NOTE — Progress Notes (Signed)
Wife at the bedside visiting pt. Wife understands that death is near. Wife would like to be called when pt passes and plans are already arranged at [facility] on [address].

## 2019-05-02 NOTE — Progress Notes (Signed)
Family/wife face timed pt this am. Plans to visit this afternoon at 3pm. MD called and updated family on current condition.

## 2019-05-02 DEATH — deceased

## 2020-11-20 IMAGING — CT CT HEAD WITHOUT CONTRAST
4 of 6 series · 17 of 47 positions shown, 19 images · non-contrast
Comparison: None.

CLINICAL DATA: Multiple recent falls

EXAM:
CT HEAD WITHOUT CONTRAST
CT CERVICAL SPINE WITHOUT CONTRAST
TECHNIQUE: Multidetector CT imaging of the head and cervical spine was
performed following the standard protocol without intravenous
contrast. Multiplanar CT image reconstructions of the cervical spine
were also generated.

[Series 4: head 5.0 h30s · axial · 0.40mm/px · z∈[-154,-24]mm · 8 of 34 slices shown, 10 images]
[im 4/34  brain]
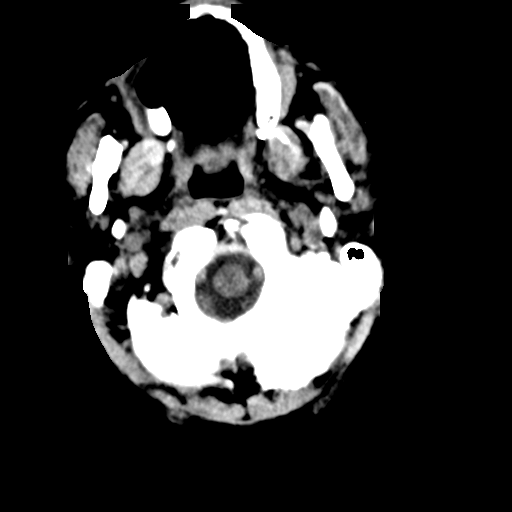
[im 4/34  bone]
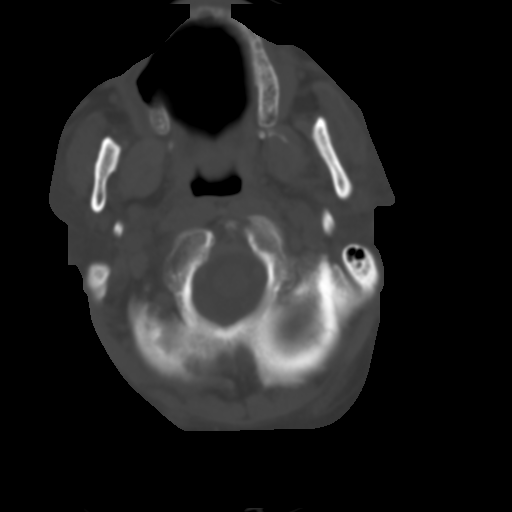
[im 8/34  brain]
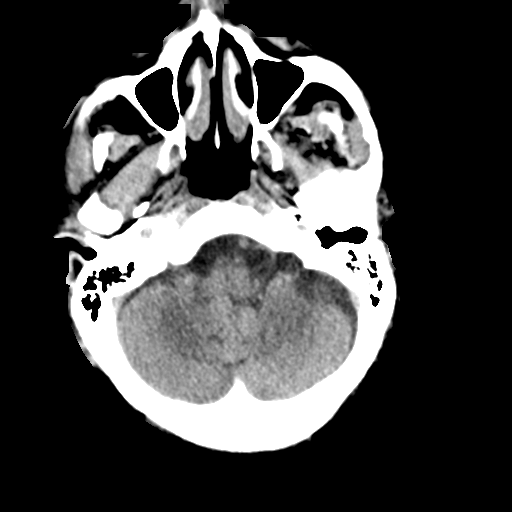
[im 12/34  brain]
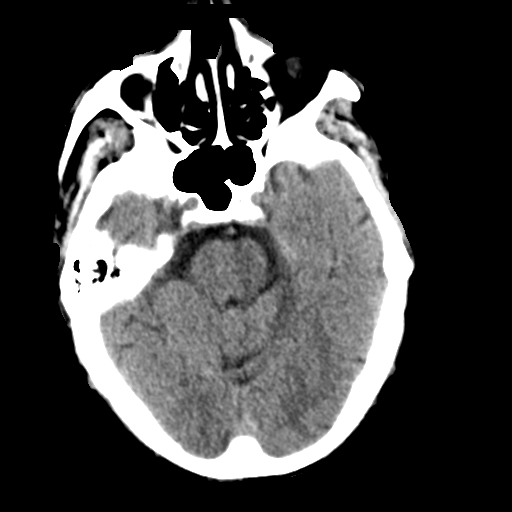
[im 15/34  brain]
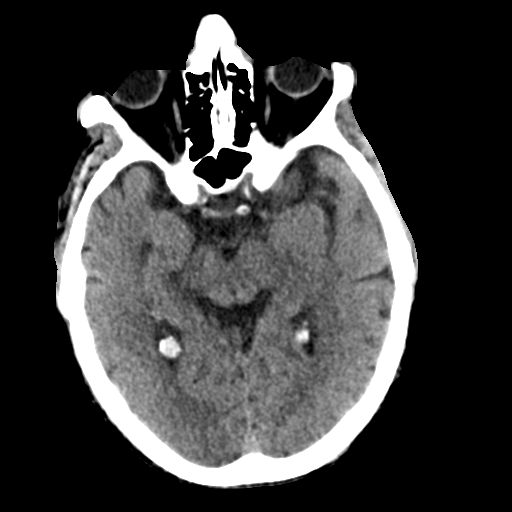
[im 19/34  brain]
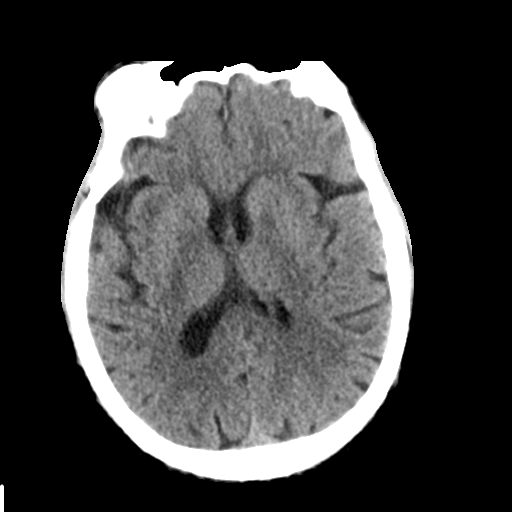
[im 19/34  bone]
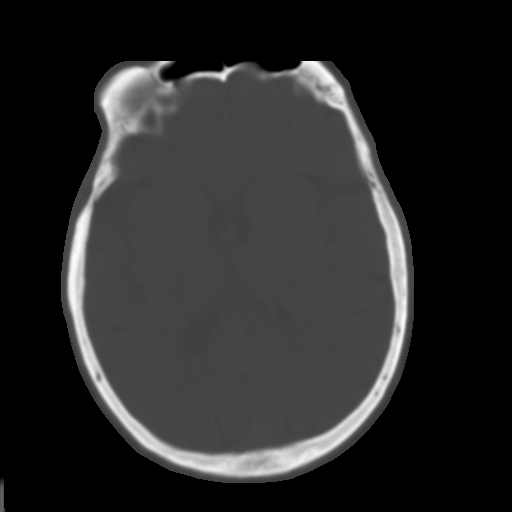
[im 23/34  brain]
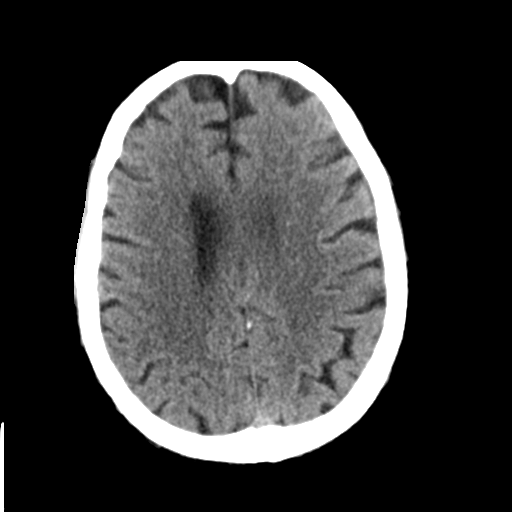
[im 26/34  brain]
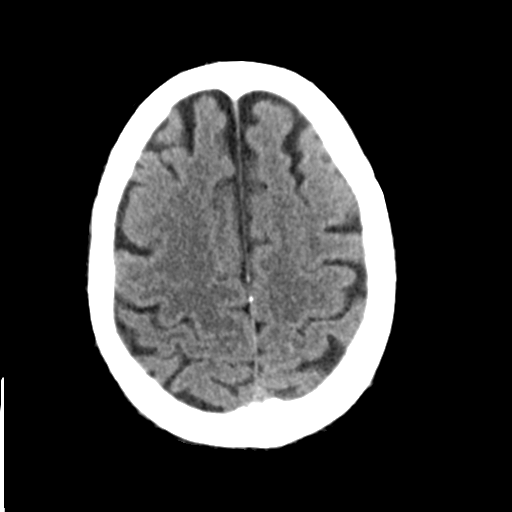
[im 30/34  brain]
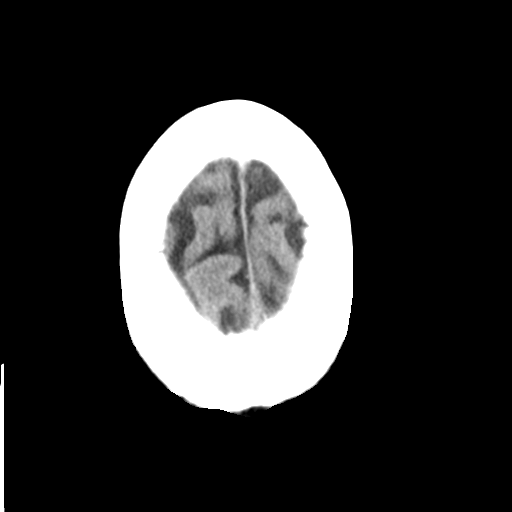

[Series 6: head 3.0 mpr cor · coronal · 0.33mm/px · 3 of 67 slices shown]
[im 25/67  brain]
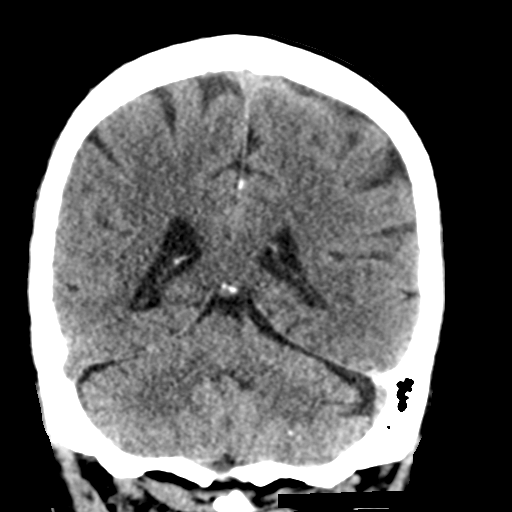
[im 34/67  brain]
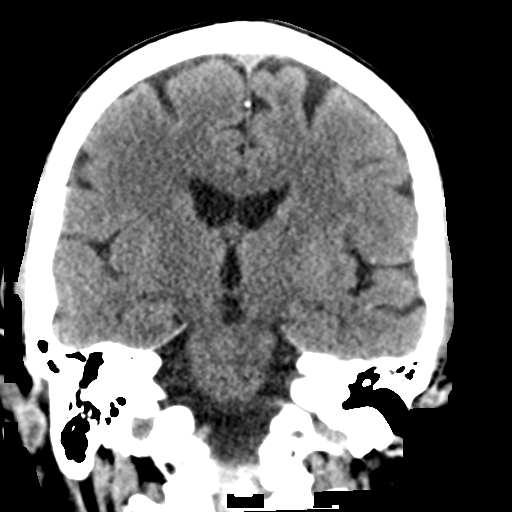
[im 42/67  brain]
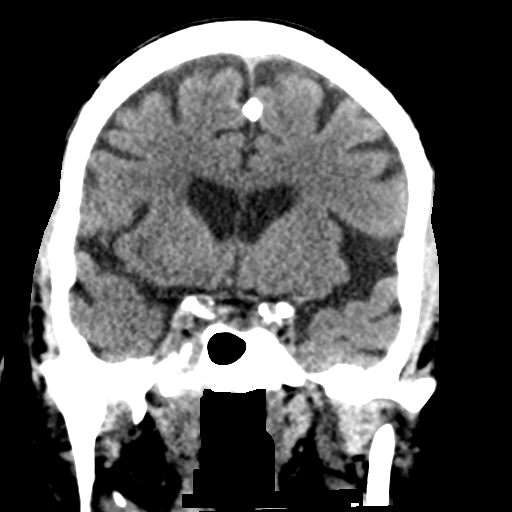

[Series 7: head 3.0 mpr sag · sagittal · 0.33mm/px · 1 of 56 slices shown]
[im 28/56  brain]
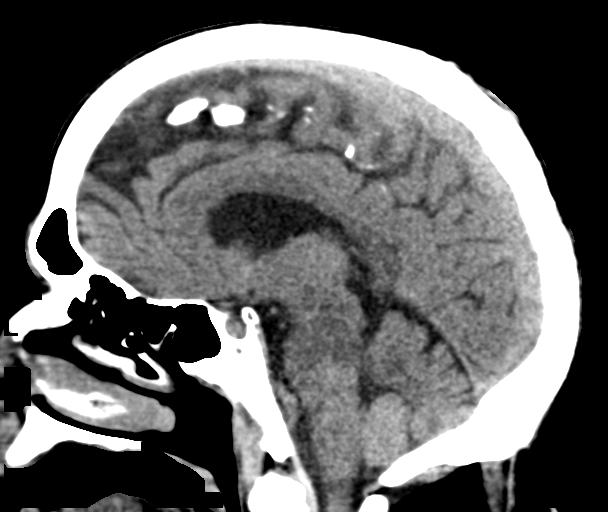

[Series 9: c_spine 2.0 st · axial · 0.29mm/px · z∈[-258,-190]mm · 5 of 72 slices shown]
[im 8/72  brain]
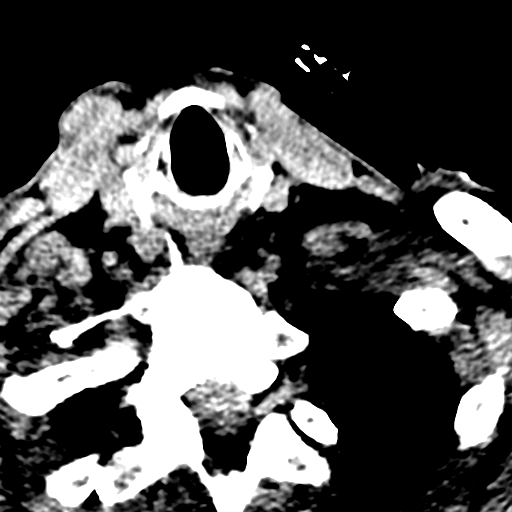
[im 15/72  brain]
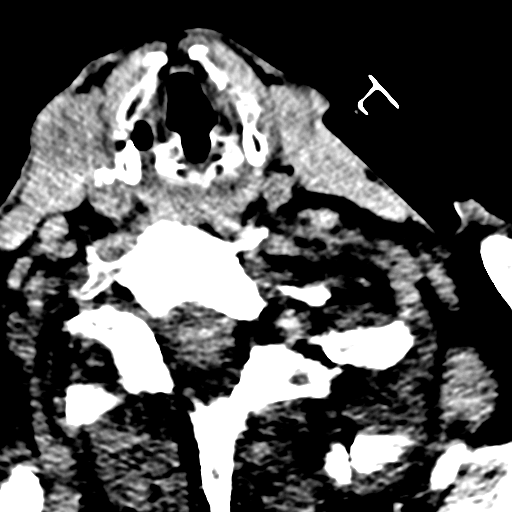
[im 23/72  brain]
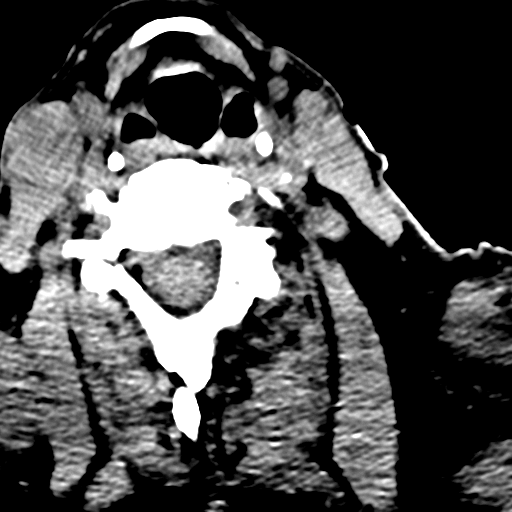
[im 30/72  brain]
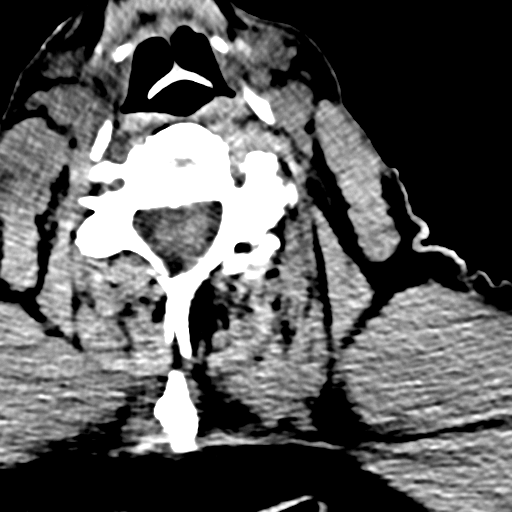
[im 42/72  brain]
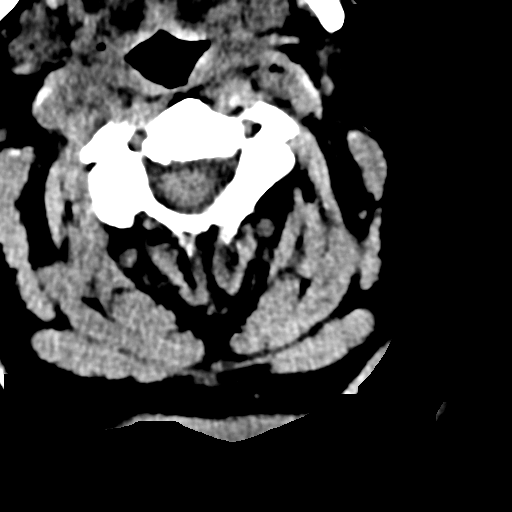

[17 of 47 positions shown; findings below may reference images not displayed]

FINDINGS: CT HEAD FINDINGS

Brain: Chronic atrophic changes are identified. No findings to
suggest acute hemorrhage, acute infarction or space-occupying mass
lesion are noted.

Vascular: No hyperdense vessel or unexpected calcification.

Skull: Normal. Negative for fracture or focal lesion.

Sinuses/Orbits: No acute finding.

Other: None.

CT CERVICAL SPINE FINDINGS

Alignment: Alignment is within normal limits.

Skull base and vertebrae: 7 cervical segments are well visualized.
There are changes from C5-C7 consistent with congenital fusion.
Osteophytic changes are noted at C3-4 and C4-5. Facet hypertrophic
changes are noted. No acute fracture or acute facet abnormality is
noted.

Soft tissues and spinal canal: Surrounding soft tissues are within
normal limits.

Upper chest: Visualized lung apices are unremarkable.

Other: None
IMPRESSION: CT of the head: Chronic atrophic changes without acute abnormality.

CT of the cervical spine: There are changes consistent with
congenital fusion from C5-C7 primarily on the left.

Multilevel degenerative change without acute abnormality.
